# Patient Record
Sex: Male | Born: 1935 | Race: White | Hispanic: No | Marital: Married | Smoking: Former smoker
Health system: Southern US, Community
[De-identification: ages and names within clinical notes are randomized; demographics above are authoritative.]

## PROBLEM LIST (undated history)

## (undated) DIAGNOSIS — E785 Hyperlipidemia, unspecified: Secondary | ICD-10-CM

## (undated) DIAGNOSIS — I10 Essential (primary) hypertension: Secondary | ICD-10-CM

## (undated) DIAGNOSIS — I2510 Atherosclerotic heart disease of native coronary artery without angina pectoris: Secondary | ICD-10-CM

## (undated) DIAGNOSIS — M109 Gout, unspecified: Secondary | ICD-10-CM

## (undated) HISTORY — DX: Essential (primary) hypertension: I10

## (undated) HISTORY — PX: CORONARY ARTERY BYPASS GRAFT: SHX141

## (undated) HISTORY — DX: Gout, unspecified: M10.9

## (undated) HISTORY — DX: Hyperlipidemia, unspecified: E78.5

## (undated) HISTORY — DX: Atherosclerotic heart disease of native coronary artery without angina pectoris: I25.10

---

## 2002-01-23 ENCOUNTER — Encounter: Payer: Self-pay | Admitting: Cardiothoracic Surgery

## 2002-01-23 ENCOUNTER — Inpatient Hospital Stay (HOSPITAL_COMMUNITY): Admission: EM | Admit: 2002-01-23 | Discharge: 2002-02-01 | Payer: Self-pay | Admitting: Internal Medicine

## 2002-01-24 ENCOUNTER — Encounter: Payer: Self-pay | Admitting: Cardiothoracic Surgery

## 2002-01-25 ENCOUNTER — Encounter: Payer: Self-pay | Admitting: Cardiothoracic Surgery

## 2002-01-26 ENCOUNTER — Encounter: Payer: Self-pay | Admitting: Cardiothoracic Surgery

## 2002-01-26 ENCOUNTER — Encounter: Payer: Self-pay | Admitting: Nephrology

## 2002-01-28 ENCOUNTER — Encounter: Payer: Self-pay | Admitting: Cardiothoracic Surgery

## 2002-01-29 ENCOUNTER — Encounter: Payer: Self-pay | Admitting: Cardiothoracic Surgery

## 2005-07-01 ENCOUNTER — Ambulatory Visit: Payer: Self-pay | Admitting: Cardiology

## 2005-07-01 ENCOUNTER — Encounter: Payer: Self-pay | Admitting: Cardiology

## 2005-07-11 ENCOUNTER — Ambulatory Visit: Payer: Self-pay | Admitting: Cardiology

## 2005-07-11 ENCOUNTER — Encounter: Payer: Self-pay | Admitting: Cardiology

## 2005-10-21 ENCOUNTER — Ambulatory Visit (HOSPITAL_COMMUNITY): Admission: RE | Admit: 2005-10-21 | Discharge: 2005-10-21 | Payer: Self-pay | Admitting: General Surgery

## 2005-11-20 ENCOUNTER — Ambulatory Visit: Payer: Self-pay | Admitting: Cardiology

## 2006-07-22 ENCOUNTER — Ambulatory Visit: Payer: Self-pay | Admitting: Cardiology

## 2007-08-05 ENCOUNTER — Ambulatory Visit (HOSPITAL_COMMUNITY): Admission: RE | Admit: 2007-08-05 | Discharge: 2007-08-05 | Payer: Self-pay | Admitting: Family Medicine

## 2007-08-25 ENCOUNTER — Ambulatory Visit: Payer: Self-pay | Admitting: Cardiology

## 2007-08-26 ENCOUNTER — Ambulatory Visit: Payer: Self-pay | Admitting: Cardiology

## 2007-09-03 ENCOUNTER — Ambulatory Visit: Payer: Self-pay | Admitting: Cardiology

## 2008-08-17 ENCOUNTER — Ambulatory Visit: Payer: Self-pay | Admitting: Cardiology

## 2009-04-17 ENCOUNTER — Ambulatory Visit: Payer: Self-pay | Admitting: Cardiology

## 2009-05-03 ENCOUNTER — Ambulatory Visit: Payer: Self-pay | Admitting: Cardiology

## 2009-05-07 ENCOUNTER — Encounter: Payer: Self-pay | Admitting: Physician Assistant

## 2009-05-23 ENCOUNTER — Ambulatory Visit: Payer: Self-pay | Admitting: Cardiology

## 2009-05-24 ENCOUNTER — Encounter: Payer: Self-pay | Admitting: Cardiology

## 2009-08-03 DIAGNOSIS — E785 Hyperlipidemia, unspecified: Secondary | ICD-10-CM | POA: Insufficient documentation

## 2009-08-03 DIAGNOSIS — I25810 Atherosclerosis of coronary artery bypass graft(s) without angina pectoris: Secondary | ICD-10-CM

## 2009-08-03 DIAGNOSIS — I10 Essential (primary) hypertension: Secondary | ICD-10-CM | POA: Insufficient documentation

## 2010-01-21 ENCOUNTER — Encounter (INDEPENDENT_AMBULATORY_CARE_PROVIDER_SITE_OTHER): Payer: Self-pay | Admitting: *Deleted

## 2010-05-09 ENCOUNTER — Ambulatory Visit: Payer: Self-pay | Admitting: Cardiology

## 2010-12-17 NOTE — Miscellaneous (Signed)
Summary: Refill Metoprolol  Clinical Lists Changes  Medications: Changed medication from METOPROLOL TARTRATE 25 MG TABS (METOPROLOL TARTRATE) to METOPROLOL TARTRATE 25 MG TABS (METOPROLOL TARTRATE) Take 1/2 tablet by mouth two times a day - Signed Rx of METOPROLOL TARTRATE 25 MG TABS (METOPROLOL TARTRATE) Take 1/2 tablet by mouth two times a day;  #90 x 3;  Signed;  Entered by: Cyril Loosen, RN, BSN;  Authorized by: Lewayne Bunting, MD, [facility];  Method used: Electronically to [facility] [address] [place], [place], [place]  [address], Ph: [phone], Fax: [phone]    Prescriptions: METOPROLOL TARTRATE 25 MG TABS (METOPROLOL TARTRATE) Take 1/2 tablet by mouth two times a day  #90 x 3   Entered by:   Cyril Loosen, RN, BSN   Authorized by:   Lewayne Bunting, MD, [facility]   Signed by:   Cyril Loosen, RN, BSN on 01/21/2010   Method used:   Electronically to        [facility] (retail)       [address]       [place], [place]  [address]       Ph: [phone]       Fax: [phone]   RxID:   [id number]

## 2010-12-17 NOTE — Assessment & Plan Note (Signed)
Summary: 1 YR F/U PER REMINDER-JM   Visit Type:  Follow-up Primary Provider:  McGough   History of Present Illness: the patient is a 76 year old male with a history of anterior wall microinfarction, status post emergent bypass grafting in April 2003.  Follow-up echocardiogram demonstrated a normal ejection fraction.  Stress test in 2000 and it was also within normal limits.  The patient is doing quite well.  He reports no chest pain or shortness of breath orthopnea PND.  His blood pressure however is poorly controlled.  Preventive Screening-Counseling & Management  Alcohol-Tobacco     Smoking Status: quit     Year Quit: 01/2002  Current Medications (verified): 1)  Furosemide 20 Mg Tabs (Furosemide) .... Take 1 Tablet By Mouth Daily 2)  Lisinopril 20 Mg Tabs (Lisinopril) .... Take 2 Tablet By Mouth Once A Day 3)  Metoprolol Tartrate 25 Mg Tabs (Metoprolol Tartrate) .... Take 1/2 Tablet By Mouth Two Times A Day 4)  Folic Acid 1 Mg Tabs (Folic Acid) .... Take 1 Tablet By Mouth Once A Day 5)  Allopurinol 300 Mg Tabs (Allopurinol) .... Take 1 Tablet By Mouth Once A Day 6)  Simvastatin 10 Mg Tabs (Simvastatin) .... Take 1 Tablet By Mouth Once A Day 7)  Levothyroxine Sodium 50 Mcg Tabs (Levothyroxine Sodium) .... Take 1 Tablet By Mouth Once A Day 8)  Aspir-Low 81 Mg Tbec (Aspirin) .... Take 1 Tablet By Mouth Once A Day 9)  Fish Oil 1200 Mg Caps (Omega-3 Fatty Acids) .... Take 3 Tablet By Mouth Once A Day 10)  Amlodipine Besylate 5 Mg Tabs (Amlodipine Besylate) .... Take 1 Tablet By Mouth Once A Day  Allergies (verified): No Known Drug Allergies  Comments:  Nurse/Medical Assistant: The patient's medication list and allergies were reviewed with the patient and were updated in the Medication and Allergy Lists.  Past History:  Past Medical History: Last updated: 08/03/2009 CAD, ARTERY BYPASS GRAFT (ICD-414.04) HYPERLIPIDEMIA-MIXED (ICD-272.4) HYPERTENSION, UNSPECIFIED (ICD-401.9)     Past Surgical History: Last updated: 08/03/2009 CABG  Social History: Smoking Status:  quit  Vital Signs:  Patient profile:   76 year old male Height:      63 inches Weight:      152 pounds BMI:     27.02 Pulse rate:   57 / minute BP sitting:   171 / 81  (left arm) Cuff size:   regular  Vitals Entered By: Carlye Grippe (May 09, 2010 3:17 PM)  Nutrition Counseling: Patient's BMI is greater than 25 and therefore counseled on weight management options.  Physical Exam  Additional Exam:  General: Well-developed, well-nourished in no distress head: Normocephalic and atraumatic eyes PERRLA/EOMI intact, conjunctiva and lids normal nose: No deformity or lesions mouth normal dentition, normal posterior pharynx neck: Supple, no JVD.  No masses, thyromegaly or abnormal cervical nodes lungs: Normal breath sounds bilaterally without wheezing.  Normal percussion heart: regular rate and rhythm with normal S1 and S2, no S3 or S4.  PMI is normal.  No pathological murmurs abdomen: Normal bowel sounds, abdomen is soft and nontender without masses, organomegaly or hernias noted.  No hepatosplenomegaly musculoskeletal: Back normal, normal gait muscle strength and tone normal pulsus: Pulse is normal in all 4 extremities Extremities: No peripheral pitting edema neurologic: Alert and oriented x 3 skin: Intact without lesions or rashes cervical nodes: No significant adenopathy psychologic: Normal affect     EKG  Procedure date:  05/09/2010  Findings:      normal sinus rhythm.  Nonpathological  inferior Q waves.  For all the progression and T wave inversions across the anterior precordial leads consistent with prior anterior wall myocardial infarction  Impression & Recommendations:  Problem # 1:  HYPERTENSION, UNSPECIFIED (ICD-401.9) amlodipine at 5 mg p.o. daily was added the patient can follow-up with his primary care physician His updated medication list for this problem  includes:    Furosemide 20 Mg Tabs (Furosemide) .Marland Kitchen... Take 1 tablet by mouth daily    Lisinopril 20 Mg Tabs (Lisinopril) .Marland Kitchen... Take 2 tablet by mouth once a day    Metoprolol Tartrate 25 Mg Tabs (Metoprolol tartrate) .Marland Kitchen... Take 1/2 tablet by mouth two times a day    Aspir-low 81 Mg Tbec (Aspirin) .Marland Kitchen... Take 1 tablet by mouth once a day    Amlodipine Besylate 5 Mg Tabs (Amlodipine besylate) .Marland Kitchen... Take 1 tablet by mouth once a day  Problem # 2:  HYPERLIPIDEMIA-MIXED (ICD-272.4) the patient is only on simvastatin 10 mg a day which is allowed with his new dosing of amlodipine. His updated medication list for this problem includes:    Simvastatin 10 Mg Tabs (Simvastatin) .Marland Kitchen... Take 1 tablet by mouth once a day  Problem # 3:  CAD, ARTERY BYPASS GRAFT (ICD-414.04) the patient has no recurrent chest pain.  No indication for stress testing at the present time.  EKG shows no acute ischemic changes.  There is probably a progression in chronically inverted T waves. His updated medication list for this problem includes:    Lisinopril 20 Mg Tabs (Lisinopril) .Marland Kitchen... Take 2 tablet by mouth once a day    Metoprolol Tartrate 25 Mg Tabs (Metoprolol tartrate) .Marland Kitchen... Take 1/2 tablet by mouth two times a day    Aspir-low 81 Mg Tbec (Aspirin) .Marland Kitchen... Take 1 tablet by mouth once a day    Amlodipine Besylate 5 Mg Tabs (Amlodipine besylate) .Marland Kitchen... Take 1 tablet by mouth once a day  Orders: EKG w/ Interpretation (93000)  Patient Instructions: 1)  Amlodipine 5mg  daily 2)  Follow up in  1 year Prescriptions: AMLODIPINE BESYLATE 5 MG TABS (AMLODIPINE BESYLATE) Take 1 tablet by mouth once a day  #30 x 6   Entered by:   Hoover Brunette, LPN   Authorized by:   Lewayne Bunting, MD, [facility]   Signed by:   Hoover Brunette, LPN on 16/08/9603   Method used:   Electronically to        [facility] (retail)       [address]       [place], [place]  [address]       Ph: [phone]       Fax: [phone]   RxID:    [id number]   Handout requested.

## 2011-01-22 ENCOUNTER — Telehealth: Payer: Self-pay | Admitting: *Deleted

## 2011-02-13 NOTE — Progress Notes (Signed)
Summary: PHONE- swelling right foot       Phone Note Call from Patient Call back at Home Phone [phone]   Caller: Patient Summary of Call: [name] called and states he thinks he needs to be seen. States that his right foot has been swelling since Monday. Does not think it is edema but not sure. Family Doctor is [facility] and states that they can not see him until next week.   Initial call taken by: Zachary George,  January 22, 2011 1:25 PM  Follow-up for Phone Call        Left message to return call.  Hoover Brunette, LPN  January 23, 980 11:01 AM   Left message to see PMD or call back if issue not resolved. Follow-up by: Hoover Brunette, LPN,  February 06, 2011 3:13 PM

## 2011-02-27 ENCOUNTER — Other Ambulatory Visit: Payer: Self-pay | Admitting: Cardiology

## 2011-04-01 NOTE — Assessment & Plan Note (Signed)
[facility]                          [facility] OFFICE NOTE   NAME:Imel, MINER KORAL                     MRN:          [id number]  DATE:04/17/2009                            DOB:          [birth year]    HISTORY OF PRESENT ILLNESS:  The patient is a 76 year old male with a  history of anterior wall myocardial function, status post emergent  bypass graft in April 2003.  The patient had a followup echocardiogram,  which showed a normalized ejection fraction.  He also had a stress test  in 2008.  He states he continues to feel quite well.  He has no chest  pain, shortness of breath, orthopnea, or PND.  He has no palpitations or  syncope.  He reports no cardiovascular symptoms.  Unfortunately, his  blood pressure is quite elevated, although the last office visit was  within normal limits.  He feels that he has white coat hypertension.   MEDICATIONS:  1. Folic acid 1 mg a day.  2. Allopurinol 300 mg p.o. daily.  3. Furosemide 20 mg a day.  4. Lisinopril 20 mg p.o. daily.  5. Lescol XL 80 mg p.o. daily.  6. Levothyroxine 50 mcg p.o. daily.  7. Aspirin 81 mg a day.  8. Fish oil 1000 mg p.o. daily.  9. Metoprolol tartrate 25 mg half-tablet p.o. b.i.d.  10.Toprol-XL 25 mg p.o. daily.   We will found out if the patient is both taking metoprolol and Toprol on  his medication list.   PHYSICAL EXAMINATION:  VITAL SIGNS:  Blood pressure is 162/82, heart  rate is 52, weight is 154 pounds.  GENERAL:  Well-nourished white male in no apparent stress.  NECK:  Normal carotid upstroke.  No carotid bruits.  LUNGS:  Clear breath sounds bilaterally.  HEART:  Regular rate and rhythm.  Normal S1 and S2.  No murmur, rubs, or  gallops.  ABDOMEN:  Soft, nontender.  No rebound or guarding.  Good bowel sounds.  EXTREMITIES:  No cyanosis, clubbing, or edema.   PROBLEM LIST:  1. Coronary artery disease.  See details above.  2. Hypertension, poorly controlled.  Rule out  white coat hypertension.  3. Dyslipidemia, followed by Dr. Regino Schultze.   PLAN:  1. The patient's blood pressure is poorly controlled, but we are not      sure of his white coat hypertension.  The patient will have his      blood pressure check at [facility] on several      occasions, and then he will come for a nurse visit in 2 weeks to      review.  2. In 1 year, the patient requires another stress test.  We will      continue otherwise on his current medical regimen.     Learta Codding, MD,FACC  Electronically Signed    GED/MedQ  DD: 04/17/2009  DT: 04/18/2009  Job #: [id number]

## 2011-04-01 NOTE — Assessment & Plan Note (Signed)
[facility]                          [facility]ICE NOTE   NAME:[name], [name]                     MRN:          [id number]  DATE:08/17/2008                            DOB:          1935-08-07    HISTORY OF PRESENT ILLNESS:  The patient is a 75 year old male with  history of anterior wall myocardial infarction status post emergent  bypass graft in April 2003.  The patient is doing well.  He has no  recurrence of substernal chest pain.  He has no orthopnea or PND.  He  does a lot of chores around the house without any difficulty.  He states  he feels quite well and has a good appetite, has lost no weight.   MEDICATIONS:  1. Folic acid 1 mg p.o. daily.  2. Allopurinol 300 mg p.o. daily.  3. Furosemide 20 mg p.o. daily.  4. Lisinopril 20 mg p.o. daily.  5. __________ 80 mg p.o. daily.  6. Levothyroxine 50 mcg p.o. daily.  7. Aspirin 81 mg p.o. daily.  8. Fish oil 1000 mg p.o. daily.  9. Metoprolol 25 mg half a tablet b.i.d.   PHYSICAL EXAMINATION:  VITAL SIGNS:  Blood pressure 120/74, heart rate  61, weight is 157 pounds.  NECK:  Normal carotid upstroke.  No carotid bruits.  LUNGS:  Clear.  HEART:  Regular rate and rhythm.  Normal S1 and S2.  No murmurs, rubs,  or gallop.  ABDOMEN:  Soft.  EXTREMITIES:  No cyanosis, clubbing, or edema.  NEUROLOGIC:  The patient is alert, oriented, and grossly nonfocal.   PROBLEM LIST:  1. Coronary artery disease.      a.     Status post acute inferior wall myocardial infarction with       right ventricular involvement __________ March 2008.      b.     Status post emergent coronary artery bypass graft.      c.     Ejection fraction is 40% normalized in followup.      d.     No recurrent chest pain, hemodynamically stable.  2. Dyslipidemia.  3. Tobacco use.  4. Hypertension, controlled.   PLAN:  The patient is doing remarkably well.  His lipid panel is  followed by his primary care physician.  He  had a stress test last year  and is not due for another 2 years for his stress test.  I  have made no change in his medications.  EKG in the office today was  essentially within normal limits.     Learta Codding, MD,FACC  Electronically Signed    GED/MedQ  DD: 08/17/2008  DT: 08/18/2008  Job #: [id number]

## 2011-04-01 NOTE — Assessment & Plan Note (Signed)
[facility]                          [facility]ICE NOTE   NAME:[name], [name]                     MRN:          [id number]  DATE:08/25/2007                            DOB:          Sep 08, 1935    HISTORY OF PRESENT ILLNESS:  The patient is a 76 year old male with a  history of anterior wall myocardial infarction, status post emergent  coronary bypass graft in April of 2003.  The patient has been doing  well.  He has had no stress testing done since 2003.  He reports no  chest pain, shortness of breath, orthopnea, PND.  Unfortunately, he did  not bring his medication list.  He states that he is on several  antihypertensive medications, but he is quite hypertensive in the office  today.  His EKG demonstrates normal sinus rhythm, no acute ischemic  changes.  He states that his exercise tolerance is actually quite good.  He blames his blood pressure problem today on the fact that he ate quite  a bit of bacon with salt this morning.   MEDICATIONS:  Unknown.  The patient will bring list in the morning.   PHYSICAL EXAMINATION:  VITAL SIGNS:  Blood pressure is 150/84, heart  rate is 52, weight is 156 pounds.  NECK EXAM:  Reveals a normal carotid upstroke, no carotid bruits.  LUNGS:  Clear breath sounds bilaterally.  HEART:  Regular rate and rhythm, normal S1, S2, no murmurs, rubs, or  gallops.  ABDOMEN:  Soft, nontender, no rebound or guarding.  Good bowel sounds.  EXTREMITY EXAM:  No cyanosis, clubbing or edema.  NEURO:  The patient is alert, oriented, grossly nonfocal.   PROBLEM LIST:  1. Coronary artery disease.      a.     Status post acute inferior wall myocardial infarction with       right ventricular involvement and cardiogenic shock, March 2003.      b.     Status post emergent coronary bypass graft.      c.     Ejection fraction 40%, normalized in followup.  2. Dyslipidemia.  3. History of tobacco use.  4. Hypertension.   PLAN:  1. The patient is doing quite well.  Unfortunately, he is hypertensive      and has really no idea about his blood pressure medications.  He      will, however, provide [place] with the medication list tomorrow.  2. Adjustments in medication will be made after we see the patient's      list.  3. The patient also will need a Cardiolite study as part of his      ischemia evaluation exam, more than 3 years post coronary bypass      graft.  4. The patient can follow up with [place], otherwise, in 1 year, and we      will defer his lipid management to his primary care physician, Dr.      Regino Schultze.     Learta Codding, MD,FACC  Electronically Signed    GED/MedQ  DD: 08/25/2007  DT: 08/26/2007  Job #: [id number]  cc:   Kirk Ruths, M.D.

## 2011-04-04 NOTE — H&P (Signed)
NAME:  [name], [name]              ACCOUNT NO.:  [id number]   MEDICAL RECORD NO.:  [id number]          PATIENT TYPE:  AMB   LOCATION:                                FACILITY:  [facility]   PHYSICIAN:  Dalia Heading, M.D.  DATE OF BIRTH:  1935/02/08   DATE OF ADMISSION:  DATE OF DISCHARGE:  LH                                HISTORY & PHYSICAL   CHIEF COMPLAINT:  Need for screening colonoscopy.   HISTORY OF PRESENT ILLNESS:  The patient is a 76 year old white male who is  referred for endoscopic evaluation.  He needs a colonoscopy for screening  purposes.  No abdominal pain, weight loss, nausea, vomiting, diarrhea,  constipation, melena, or hematochezia have been noted.  He has never had a  colonoscopy.  There is no family history of colon carcinoma.   PAST MEDICAL HISTORY:  1.  Hypertension.  2.  Coronary artery disease.   PAST SURGICAL HISTORY:  CABG in 2003.   CURRENT MEDICATIONS:  Lescol, Toprol, Lisinopril, folic acid, Lasix, baby  aspirin, fish oil.   ALLERGIES:  No known drug allergies.   REVIEW OF SYSTEMS:  Noncontributory.   PHYSICAL EXAMINATION:  GENERAL:  The patient is a well-developed, well-  nourished, white male in no acute distress.  LUNGS:  Clear to auscultation with equal breath sounds bilaterally.  HEART:  Reveals a regular rate and rhythm without S3, S4, or murmurs.  ABDOMEN:  Soft, nontender, nondistended.  No hepatosplenomegaly or masses  are noted.  RECTAL:  Deferred to the procedure.   IMPRESSION:  Need for screening colonoscopy.   PLAN:  The patient is scheduled for a colonoscopy on October 21, 2005.  The  risks and benefits of the procedure, including bleeding and perforation,  were fully explained to the patient, gave informed consent.      Dalia Heading, M.D.  Electronically Signed     MAJ/MEDQ  D:  09/30/2005  T:  09/30/2005  Job:  [id number]   cc:   Dalia Heading, M.D.  Fax: [phone]   [facility]  Fax: [phone]   Corrie Mckusick, M.D.  Fax: [phone]

## 2011-04-04 NOTE — Cardiovascular Report (Signed)
[facility]  Patient:    ANKITH, EDMONSTON Visit Number: [id number] MRN: [id number]          Service Type: MED Location: 2300 [id number] Attending Physician:  Waldo Laine Dictated by:   Veneda Melter, M.D. [facility] Proc. Date: 01/23/02 Admit Date:  01/23/2002   CC:         Dr. Lilian Kapur, [place]  Doylene Canning. Ladona Ridgel, M.D. [facility]   Cardiac Catheterization  PROCEDURES PERFORMED: 1. Left heart catheterization. 2. Left ventriculogram. 3. Selective coronary angiography. 4. Abdominal aortogram. 5. Placement of temporary right ventricular pacemaker. 6. Placement of intra-aortic balloon pump.  DIAGNOSES: 1. Three-vessel coronary artery disease. 2. Mild left ventricular systolic dysfunction. 3. Acute inferior wall myocardial infarction. 4. Third degree AV block. 5. Cardiogenic shock.  HISTORY:  Mr. Hoffmann is a 76 year old gentleman with a history of hypertension, gastroesophageal reflux disease as well as a long history of tobacco use who presents with substernal chest discomfort.  The patient had pain starting approximately at midnight of January 22, 2002.  This waxed and waned throughout the night and at approximately 10:10 a.m., he presented to [facility].  He was found to have acute inferior wall myocardial infarction with hypertension and third degree AV block.  He was transferred to [facility] for emergent cardiac catheterization.  Upon presentation, the patient had persistent third degree AV block as well as hypertension despite inotropes and IV fluids.  He had some nausea and discomfort in route that improved upon arrival to [facility].  DESCRIPTION OF PROCEDURE:  Informed consent was obtained.  The patient was brought to the catheterization lab.  Both groins were sterilely prepped and draped.  Lidocaine, 1%, was used to infiltrate the right groin and a #7 [place] arterial and a #6 [place] venous sheath placed using the modified  Seldinger technique.  A temporary RV pacemaker was then placed via the right femoral vein at the RV apex and appropriate pacing parameters set.  Left heart catheterization, left ventriculogram, abdominal aortogram, and selective coronary angiography were then performed using preformed #6 French Judkins catheters.  Initial findings are as follows:  FINDINGS: 1. Left main trunk:  Mild irregularities, mid narrowing of 20%. 2. LAD:  This begins as a large caliber vessel and tapers in the mid section.    The LAD has a high-grade eccentric narrowing of at least 95% in the    proximal segment.  There is TIMI-2 flow in the distal vessel. 3. Left circumflex artery:  Medium caliber vessel that consists of a    bifurcating marginal branch in the mid section.  The AV circumflex has a    high-grade narrowing of 70% prior to this marginal branch. 4. Ramus intermedius:  This is a small caliber vessel with mild disease of    30% in the proximal segment. 5. Right coronary artery:  This is a medium caliber vessel that is 100%    occluded proximally.  No distal filling is noted.  LEFT VENTRICULOGRAM:  LV -- mildly dilated end systolic and end diastolic dimensions.  Overall left ventricular function is mildly impaired.  Ejection fraction approximately 40%.  There is akinesis of the inferior wall.  Mitral regurgitation, 1+, noted.  HEMODYNAMIC DATA:  LV pressure is 66/15.  Aortic was 66/40.  LVEDP equals 20.  ABDOMINAL AORTOGRAM:  The abdominal aorta is normal caliber with mild atheromatous buildup.  The renal arteries are single and widely patent bilaterally.  The iliac arteries have mild  disease in the proximal segment.  With these findings, we proceeded with placement of percutaneous balloon pump. The left groin was infiltrated with lidocaine and a balloon pump placed in the descending aorta.  This was seen to function well.  The patient subsequently had blood pressure of 75/47.  He had a heart rate  of 70 paced.  Mean pressure was 70.  Augmented systolic was 99.  This was with fluid wide open and dopamine at 5 cc per hour.  Given the extensive nature of his coronary disease, Dr. Tyrone Sage of the surgical service was consulted and it was felt that the patient would benefit from emergent surgical revascularization. Preparations were thus made to transfer the patient to the operating theatre. He was transferred in critical condition. Dictated by:   Veneda Melter, M.D. [facility] Attending Physician:  Waldo Laine DD:  01/23/02 TD:  01/23/02 Job: [id number]

## 2011-04-04 NOTE — Consult Note (Signed)
[name]. [facility]  Patient:    DEMONE, [name] Visit Number: [id number] MRN: [id number]          Service Type: MED Location: 2300 [id number] [id number] Attending Physician:  [name] Dictated by:   [name], M.D. Proc. Date: 01/23/02 Admit Date:  01/23/2002   CC:         [name], M.D. [facility]   Consultation Report  PHYSICIANS: Requesting physician:  [name], M.D. [facility] Followup cardiologist:  [name], M.D. [facility] Primary care physician: Unknown.  REASON FOR CONSULTATION:  Emergency coronary artery bypass, acute myocardial infarction, cardiogenic shock.  HISTORY OF PRESENT ILLNESS:  The patient is a 76 year old male who approximately 12 to 14 hours prior to consultation began having prolonged episode of chest pain and "feeling bad."  During the night, his wife noted that he tossed and turned and was uncomfortable.  Earlier today, he finally went to the emergency room in [place] and was noted to have EKG changes of acute myocardial infarction with complete heart block, hypotension with blood pressure in the 70s.  CK on admission to [facility] was 1037 with MB of 186, troponin 6.88.  PAST MEDICAL HISTORY:  The patient denies diabetes.  He is a heavy smoker and has for many years.  Denies previous TIAs or strokes, denies peripheral vascular disease.  The patient denies renal insufficiency, but his creatinine at [facility] was 2.3 today.  The patient denies any previous surgery.  SOCIAL HISTORY:  The patient is married, lives with his wife and is employed as a Sports coach.  MEDICATIONS:  The patient was unsure of his home medications.  He was given a bolus of Integrilin and heparin and was on dopamine infusion at the time of exam.  DRUG ALLERGIES:  None known.  REVIEW OF SYSTEMS:  Performed and recorded by the cardiologist.  In general, the patient had no previous constitutional symptoms, but in the past 12 hours had  become gravely ill.  Denies previous shortness of breath or hemoptysis. Denies any history of GI bleed.  Denies any psychiatric history.  Denies claudication.  PHYSICAL EXAMINATION:  GENERAL:  The patient was plethoric from the mid chest up, cyanotic, on oxygen and in obvious distress and diaphoretic.  VITAL SIGNS:  Blood pressure was in the high 70s on dopamine.  Heart rate was 70, ventricularly paced by transvenous temporary pacer.  Respiratory rate 20.  HEENT:  Pupils were equal and reactive to light.  NECK:  Without carotid bruits.  CHEST:  Distant breath sounds bilaterally without wheezes.  CARDIAC:  The intra-aortic balloon pump was audible.  I do not hear any murmur of mitral regurgitation or murmur consistent with the VSD.  ABDOMEN:  Benign without palpable masses.  EXTREMITIES:  The patient has faintly palpable DP and PT pulses bilaterally, appears to have adequate vein for bypass in both lower extremities.  DIAGNOSTIC DATA:  Labs done at [facility]: Hematocrit was 38.6, white count 10.9, platelet count 222,000.  BUN 18, creatinine 2.3, potassium 4.3. Other labs are pending.  Cardiac catheterization: Placement of balloon pump was performed here.   His left main is patent.  He has greater than 95% long stenosis of the LAD, 70% proximal stenosis of the circumflex.  The right coronary artery is stumped off in the proximal vessel with very little collateral filling evident.  Ejection fraction is approximately 30 to 40% with inferior hypokinesis.  ASSESSMENT:  After reviewing the films and  anatomic situation, the patient has little chance for survival without proceeding with emergency coronary artery bypass grafting with complete heart block cardiogenic shock and critical three-vessel disease, probably with element of right ventricular infarct because of the total occlusion of the right coronary artery.  This was discussed with the patient and his family, and the  patient and family were agreeable with proceeding.  They understand the increased risk of the procedure.  IMPRESSION:  The patient has acute myocardial infarction x 12 hours with right ventricular infarct, cardiogenic shock, complete heart block, and critical three-vessel coronary artery disease.  PLAN:  Emergency coronary artery bypass grafting. Dictated by:   [name], M.D. Attending Physician:  [name] DD:  01/23/02 TD:  01/24/02 Job: [id number]

## 2011-04-04 NOTE — H&P (Signed)
[place]. [facility]  Patient:    [name], [name] Visit Number: [id number] MRN: [id number]          Service Type: MED Location: 2300 [id number] [id number] Attending Physician:  Waldo Laine Dictated by:   Doylene Canning. Ladona Ridgel, M.D. [facility] Admit Date:  01/23/2002   CC:         _________________   History and Physical  ADMITTING DIAGNOSIS:  Acute myocardial infarction complicated by hypotension and shock.  HISTORY OF PRESENT ILLNESS:  The patient is a very pleasant 76 year old man who has otherwise been healthy, whose past medical history is notable for hypertension and tobacco abuse.  The patient was in his usual state of health until this past midnight, when he developed substernal chest pain which was off and on over the course of several hours.  He presented to the [facility] Emergency Department where he was found to have acute inferior myocardial infarction with hypotension and bradycardia.  He was subsequently treated with dopamine and fluids and his pressure increased into the 140-to-150 range and he was transferred down for additional evaluation and treatment.  The patient denies syncope or near-syncope.  He denies a previous history of chest pain or shortness of breath and has overall been healthy.  PAST MEDICAL HISTORY:  His past medical history is previously noted with hypertension as well as with tobacco abuse.  There is a remote history of hyperlipidemia.  FAMILY HISTORY:  The family is notable for father dying of a stroke and mother dying of myocardial infarction.  SOCIAL HISTORY:  The social history is notable for one to two packs of cigarettes per day.  He denies any ethanol abuse.  He is married.  REVIEW OF SYSTEMS:  Negative for headaches or visual changes.  He denies any hearing problems.  He denies any chest pain or shortness of breath up until his previous admission.  He denies any hemoptysis or cough.  He denies any nausea, vomiting,  diarrhea or constipation.  He denies any changes in his bowel habit.  He denies any problems with arthritis.  He denies any skin problems.  He denies any neurologic changes.  PHYSICAL EXAMINATION:  GENERAL:  He is a pleasant middle-aged man acutely ill but in no respiratory distress.  HEENT:  Normocephalic and atraumatic.  The pupils were equal and round.  The oropharynx was moist.  NECK:  No jugular venous distention.  The thyroid was not appreciably enlarged.  CARDIOVASCULAR:  Irregular bradycardia with an S4 gallop.  ABDOMEN:  Soft, nontender, nondistended.  There is no organomegaly.  EXTREMITIES:  No cyanosis, clubbing or edema.  His distal extremities were warm.  LABORATORY AND ACCESSORY DATA:  EKG demonstrates complete heart block with inferior ST elevation consistent with inferior myocardial infarction.  IMPRESSION: 1. Acute myocardial infarction. 2. History of hypertension. 3. Tobacco abuse.  DISCUSSION:  The patient will be taken urgently to the cardiac catheterization lab for urgent catheterization and possible percutaneous intervention. Dictated by:   Doylene Canning. Ladona Ridgel, M.D. [facility] Attending Physician:  Waldo Laine DD:  01/23/02 TD:  01/24/02 Job: [id number]

## 2011-04-04 NOTE — Op Note (Signed)
[name]. [facility]  Patient:    [name], [name] Visit Number: [id number] MRN: [id number]          Service Type: MED Location: 2000 2006 01 Attending Physician:  [name] Dictated by:   Lissa Merlin, P.A. Proc. Date: 01/23/02 Admit Date:  01/23/2002 Discharge Date: 02/01/2002   CC:         [name]. [name], M.D. [facility]  CVTS Office  [name], M.D. [facility]   Operative Report  DATE OF BIRTH: 02-Jan-1935  ADMISSION DIAGNOSIS: Acute inferior myocardial infarction with cardiogenic shock.  DISCHARGE DIAGNOSES: 1. Status post acute myocardial infarction. 2. Severe three vessel coronary artery disease with mild left ventricular    dysfunction. EF of 40%. 3. Cardiogenic shock, status post balloon pump. 4. Acute renal insufficiency, resolved. 5. Chronic obstructive pulmonary disease. 6. Mild deconditioning.  PAST MEDICAL HISTORY: 1. No previous history of coronary artery disease. He did have a family    history of coronary artery disease, however. 2. Heavy smoking. 3. Hyperlipidemia.  HISTORY OF PRESENT ILLNESS: The patient is a 76 year old male who was in his usual state of good health when 12-14 hours prior to admission, he began having chest pain and general malaise. During the night, he tossed and turned and was uncomfortable. The next morning, he went to the emergency room in [place] and was found to have an acute myocardial infarction with complete heart block, hypotension with blood pressure in the 70s. He was transferred to [facility]. He went to the cardiac cath lab emergently. This showed advanced three vessel coronary disease with mild left ventricular dysfunction. Because the inferior MI was complicated with right ventricular involvement and cardiac shock, in addition to third degree heart-block, [name] was consulted for emergent CABG. Intra-aortic balloon pump was inserted in the interim time. [name]  agreed that emergency CABG was the patients only chance of survival. This was explained to the patient and it was agreed to proceed.  HOSPITAL COURSE: He went to the operating room emergently and had CABG times three with the following grafts: Single to LAD, saphenous vein graft to distal RCA, saphenous vein graft to circumflex. He tolerated the procedure well. Postop, [name] initially required ionotropics and continued use of the balloon pump to maintain adequate blood pressure. This continued until postop day #2 when the balloon pump was discontinued and his drips were weaned off. He remained stable off the drips and the balloon pump. He was extubated on postop day #2. He was also found to have mild renal insufficiency with creatinine of 1.7. This slowly came down to normal. [name] was transferred to unit 2000 where he began working with cardiac rehab. He had some mild deconditioning and required a walker but did fairly well. He also had smoking cessation consult. By February 01, 2002, postop day #9, he was doing well on unit 2000. He was in normal sinus rhythm. Blood pressure was satisfactory. He was afebrile. He was diuresing well. Creatinine was 1.3.  He had no complaints. Physical exam was satisfactory. Lungs were clear. There was no edema in the extremities. Wounds were healing well. He was discharged home in stable condition that afternoon.  PROCEDURES: 1. Emergent cardiac cath on January 23, 2002. 2. Emergent CABG X3 on January 23, 2002.  MEDICATIONS: 1. Toprol XL 25 mg 1 p.o. q.a.m. 2. Enteric coated aspirin 325 mg 1 p.o. q.d. 3. Altace 2.5 mg 1 p.o. q.d. 4. Folic acid 1  mg p.o. q.d. 5. Percocet 5/325 1-2 p.o. q.4-6h. p.r.n. for pain. 6. Lasix 40 mg 1 p.o. q.d. 7. KCL 20 meq 1 p.o. q.d.  ALLERGIES: None known.  DISCHARGE CONDITION: Stable.  SPECIAL INSTRUCTIONS: [name] was told to do no driving, heavy lifting, strenuous activity. He was told to walk daily and  use his incentive spirometry daily. He was told that he could shower and to clean his wounds gently daily with soap and water and to call the office if he noticed anything unusual with his wounds or if he developed a fever. He was told to get a chest x-ray when he saw his cardiologist in follow-up and to bring it with him to see [name].  FOLLOW-UP: 1. He was to see [name], P.A.C. with [name] at the [facility]    on February 21, 2002 at 2:15 pm. 2. [name] three weeks after discharge. Office would call with appointment. Dictated by:   Lissa Merlin, P.A. Attending Physician:  [name] DD:  02/28/02 TD:  02/28/02 Job: [id number]

## 2011-04-04 NOTE — Op Note (Signed)
[place]. [facility]  Patient:    [name], [name] Visit Number: [id number] MRN: [id number]          Service Type: MED Location: 2300 2302 01 Attending Physician:  Waldo Laine Dictated by:   Gwenith Daily Tyrone Sage, M.D. Proc. Date: 01/23/02 Admit Date:  01/23/2002   CC:         Doylene Canning. Ladona Ridgel, M.D. [facility]   Operative Report  PREOPERATIVE DIAGNOSIS:  Acute myocardial infarction with severe three-vessel disease.  POSTOPERATIVE DIAGNOSIS:  Acute myocardial infarction with severe three-vessel disease with acute inferior and right ventricular infarct.  OPERATION PERFORMED:  Coronary artery bypass grafting x 3 with left internal mammary artery to the left anterior descending coronary artery, reversed saphenous vein graft to the circumflex, reversed saphenous vein to the distal right coronary artery.  SURGEON:  Gwenith Daily. Tyrone Sage, M.D.  FIRST ASSISTANT:  Maxwell Marion, RN, FA  ANESTHESIA:  General.  INDICATIONS FOR PROCEDURE:  The patient is a 76 year old male who began having pain approximately 12 hours prior to his presentation to the hospital.  At first the pain was intermittent but then became constant.  The patient presented to the [facility] emergency department with hypotensive and was transferred to [facility].  When seen in the cath lab he was hypotensive with blood pressure of 70 on Dopamine with intra-aortic balloon pump with plethoric discoloration from his nipple line up in obvious cardiogenic shock.  The patient was neurologic but he awakened able to talk.  Films were reviewed demonstrating greater than 99% stenosis of the proximal LAD, 80 to 90% proximal circumflex and total occlusion of the right coronary artery in the proximal third.  Because of the patients severe three-vessel disease, it was felt that proceeding with emergency coronary artery bypass grafting with intra-aortic balloon pump support and pressors was his most likely chance  of survival.  The patient and his family agreed.  At the time of surgery his creatinine was 2.3.  Both he and his family were aware of the critical nature and the risks of his current situation.  DESCRIPTION OF PROCEDURE:  With Swan-Ganz and arterial line monitors in place, the patient underwent general endotracheal anesthesia with intra-aortic balloon pump support.  He was maintained hemodynamically.  The skin of the chest and legs was prepped with Betadine and draped in the usual sterile manner.  In the operating room we were able to stabilize his blood pressure. He had a transvenous pacing wire in.  Vein was harvested from each lower extremity below the knee.  A median sternotomy was performed and the left internal mammary artery was dissected down as a pedicle graft.  The distal artery was divided and had reasonable flow.  The pericardium was opened.  The patient was systemically heparinized.  The ascending aorta and the right atrium were cannulated in the aortic root.  A bent cardioplegia needle was introduced into the ascending aorta.  The patient was placed on cardiopulmonary bypass at 2.4 L per minute per meter squared.  Sites for anastomosis were selected and dissected out of the epicardium.  The distal right coronary artery was present but was small.  The aortic crossclamp was applied.  500 cc of cold blood potassium cardioplegia was administered with rapid diastolic arrest of the heart.  Attention was turned first to the distal right coronary artery which was opened and was between 1.3 and 1.4 mm in size.  Using a running 7-0 Prolene and distal anastomosis was performed with a segment  of reversed saphenous vein graft.  Attention was then turned to the circumflex coronary artery which was slightly larger.  The vessel was opened and admitted a 1.5 mm probe.  Using a running 7-0 Prolene, a distal anastomosis was performed.  Attention was then turned to the left anterior  descending coronary artery which was opened.  This was a relatively small vessel.  It did admit a 1 mm probe distally and a 1.5 mm proximally.  Using a running 8-0 Prolene a left internal mammary artery was anastomosed to the left anterior descending coronary artery.  With release of the Edwards bulldog on the mammary artery, there was appropriate rise in myocardial septal temperature.  The aortic cross clamp was removed.  Total cross clamp time was 37 minutes.  The patient spontaneously converted to a sinus rhythm.  Partial occlusion clamp was placed on the ascending aorta.  Two punch aortotomies were performed.  Each of the two vein grafts were anastomosed to the ascending aorta.  Air was evacuated from the grafts.  The partial occlusion clamp was removed.  Sites of anastomosis were inspected and free of bleeding.  Because of diffuse oozing, the patient was given platelet transfusion after separation from bypass.  With the intra-aortic balloon pump, with milrinone and dopamine infusions, the patient was weaned from cardiopulmonary bypass without difficulty.  On examination of the heart, there was a reactive hyperemia along the interior wall but he still had obvious element of right ventricular and inferior myocardial infarction which was significant.  He was decannulated in the usual fashion.  Protamine sulfate was administered.  With the operative field hemostatic, two atrial and two ventricular pacing wires were brought out through the chest wall.  With the dopamine infusion, the patient had intermittently returned to a sinus rhythm but still had episodes of complete heart block.  Because of distention of the right ventricle, we were not able to close the pericardium. Mediastinal fat and tissue was closed over the ascending aorta to protect it in the future.  The sternum was closed with #6 stainless steel wire, the fascia was closed with interrupted 0 Vicryl and running 3-0 Vicryl in  the subcutaneous tissue and 4-0 subcuticular stitch in the skin edges.  Dry  dressings were applied.  The sponge and needle counts were reported as correct at completion of the procedure.  The patient tolerated the procedure without obvious complication and was transferred to the surgical intensive care unit for further postoperative care. Dictated by:   Gwenith Daily Tyrone Sage, M.D. Attending Physician:  Waldo Laine DD:  01/25/02 TD:  01/26/02 Job: [id number]

## 2011-04-04 NOTE — Assessment & Plan Note (Signed)
[name]                            [name] NOTE   NAME:[name], [name] GEISEN                     MRN:          [id number]  DATE:07/22/2006                            DOB:          October 02, 1935    PRIMARY CARDIOLOGIST:  [name] Codding, MD   REASON FOR VISIT:  Scheduled return follow-up.   [name] is a 76 year old male, status post anterior wall MI, treated  with subsequent CABG April 2003, last seen here in the clinic in January of  this year.   Since then, the patient continues to do extremely well from a cardiovascular  standpoint with no interim development of signs/symptoms suggestive of  unstable angina pectoris or congestive heart failure.   The patient reports compliance with his medications, has not resumed smoking  tobacco since his heart attack, and has close monitoring of his lipid  profile by Dr. Regino [name].   CURRENT MEDICATIONS:  1. Toprol XL 25 mg daily.  2. Lisinopril 20 mg daily.  3. Lescol XL 80 mg daily.  4. Lasix 20 mg daily.  5. Coated aspirin 81 mg daily.  6. Fish oil twice daily.  7. Allopurinol.   PHYSICAL EXAMINATION:  VITAL SIGNS:  Blood pressure 122/60, pulse 64 and  regular, weight 157.  GENERAL:  A 76 year old male, no apparent distress.  HEENT:  Normocephalic, atraumatic.  NECK:  Palpable carotid pulses without bruits.  LUNGS:  Clear to auscultation in all fields.  CARDIAC:  Regular rate and rhythm (S1, S2), no murmurs, rubs or gallops.  ABDOMEN:  Soft, nontender, with intact bowel sounds.  EXTREMITIES:  Intact pulses with trace pedal edema.  NEUROLOGIC:  No focal deficits.   IMPRESSION:  1. Coronary artery disease.      a.     Status post acute inferior myocardial infarction with right       ventricular involvement with associated cardiogenic shock, March 2003.      b.     Treated with emergent 3-vessel coronary artery bypass graft:       Left internal mammary artery-left anterior  descending artery;       saphenous vein graft-circumflex; saphenous vein graft-right coronary       artery.      c.     Ejection fraction of 40% with inferior wall akinesis by       catheterization; normalized left ventricular function with no wall       motion abnormalities by a follow-up echocardiogram December 2004.  2. Hyperlipidemia.      a.     Followed by Dr. Regino [name].      b.     LDL goal of 70 or less.  3. History of tobacco.  4. Hypertension.   PLAN:  Continue current medication regimen.  Schedule return clinic follow-  up with Dr. Lewayne [name] in 1 year.                                   [name] Serpe, PA-C  [name] Codding, MD,FACC   GS/MedQ  DD:  07/22/2006  DT:  07/23/2006  Job #:  [id number]   cc:   [name], M.D.

## 2011-06-11 ENCOUNTER — Encounter: Payer: Self-pay | Admitting: Cardiology

## 2011-06-19 ENCOUNTER — Encounter: Payer: Self-pay | Admitting: Cardiology

## 2011-07-07 ENCOUNTER — Encounter: Payer: Self-pay | Admitting: Cardiology

## 2011-07-07 ENCOUNTER — Ambulatory Visit (INDEPENDENT_AMBULATORY_CARE_PROVIDER_SITE_OTHER): Payer: Commercial Indemnity | Admitting: Cardiology

## 2011-07-07 VITALS — BP 183/79 | HR 56 | Ht 63.0 in | Wt 158.0 lb

## 2011-07-07 DIAGNOSIS — I2510 Atherosclerotic heart disease of native coronary artery without angina pectoris: Secondary | ICD-10-CM

## 2011-07-07 DIAGNOSIS — I10 Essential (primary) hypertension: Secondary | ICD-10-CM

## 2011-07-07 DIAGNOSIS — I25810 Atherosclerosis of coronary artery bypass graft(s) without angina pectoris: Secondary | ICD-10-CM

## 2011-07-07 MED ORDER — CHLORTHALIDONE 25 MG PO TABS: 25.0000 mg | ORAL_TABLET | Freq: Every day | ORAL | Status: DC

## 2011-07-07 MED ORDER — AMLODIPINE BESYLATE 10 MG PO TABS: 10.0000 mg | ORAL_TABLET | Freq: Every day | ORAL | Status: DC

## 2011-07-07 MED ORDER — SIMVASTATIN 10 MG PO TABS: 10.0000 mg | ORAL_TABLET | Freq: Every day | ORAL | Status: DC

## 2011-07-07 NOTE — Assessment & Plan Note (Signed)
Blood pressure poorly controlled. Will increase amlodipine to 10 mg by mouth daily and change Lasix to chlorthalidone 25 minutes by mouth daily

## 2011-07-07 NOTE — Assessment & Plan Note (Signed)
Ischemic cardiomyopathy with normal ejection fraction continue medical therapy

## 2011-07-07 NOTE — Patient Instructions (Signed)
   Stop Lasix   Change to Chlorthalidone 25mg  daily  Increase Amlodipine to 10mg  daily  Refills given for Simvastatin Your physician wants you to follow up in:  1 year.  You will receive a reminder letter in the mail one-two months in advance.  If you don't receive a letter, please call our office to schedule the follow up appointment

## 2011-07-07 NOTE — Progress Notes (Signed)
HPI The patient is a 76 year old male with history of anterior wall myocardial infarction, status post emergent bypass surgery in April 2003. The patient has however in followup normal ejection fraction. A stress test couple of years ago was also within normal limits. The patient is doing well. He reports no chest pain or shortness of breath orthopnea PND he has no palpitations or syncope. Unfortunately, his blood pressure remains poorly controlled. The patient reports to me that he has run out of some of his medications including his amlodipine. From a cardiovascular standpoint is otherwise stable.  No Known Allergies  Current Outpatient Prescriptions on File Prior to Visit  Medication Sig Dispense Refill  . allopurinol (ZYLOPRIM) 300 MG tablet Take 300 mg by mouth daily.        Marland Kitchen aspirin (ASPIR-LOW) 81 MG EC tablet Take 81 mg by mouth daily.        . folic acid (FOLVITE) 1 MG tablet Take 1 mg by mouth daily.        . Levothyroxine Sodium 50 MCG CAPS Take 1 capsule by mouth daily.       . metoprolol tartrate (LOPRESSOR) 25 MG tablet TAKE ONE-HALF TABLET BY MOUTH TWICE DAILY  90 tablet  3  . Omega-3 Fatty Acids (FISH OIL) 1200 MG CAPS Take 3,600 mg by mouth daily.          Past Medical History  Diagnosis Date  . CAD (coronary artery disease)   . HLD (hyperlipidemia)     mixed  . HTN (hypertension)     unspecified    Past Surgical History  Procedure Date  . Coronary artery bypass graft     No family history on file.  History   Social History  . Marital Status: Married    Spouse Name: N/A    Number of Children: N/A  . Years of Education: N/A   Occupational History  . Not on file.   Social History Main Topics  . Smoking status: Former Smoker -- 1.0 packs/day for 48 years    Types: Cigarettes    Quit date: 11/17/2001  . Smokeless tobacco: Never Used  . Alcohol Use: Not on file  . Drug Use: Not on file  . Sexually Active: Not on file   Other Topics Concern  . Not on  file   Social History Narrative  . No narrative on file   [id number] positives as outlined above. The remainder of the 18  point review of systems is negative  PHYSICAL EXAM BP 183/79  Pulse 56  Ht 5\' 3"  (1.6 m)  Wt 158 lb (71.668 kg)  BMI 27.99 kg/m2  General: Well-developed, well-nourished in no distress Head: Normocephalic and atraumatic Eyes:PERRLA/EOMI intact, conjunctiva and lids normal Ears: No deformity or lesions Mouth:normal dentition, normal posterior pharynx Neck: Supple, no JVD.  No masses, thyromegaly or abnormal cervical nodes Lungs: Normal breath sounds bilaterally without wheezing.  Normal percussion Cardiac: regular rate and rhythm with normal S1 and S2, no S3 or S4.  PMI is normal.  No pathological murmurs Abdomen: Normal bowel sounds, abdomen is soft and nontender without masses, organomegaly or hernias noted.  No hepatosplenomegaly MSK: Back normal, normal gait muscle strength and tone normal Vascular: Pulse is normal in all 4 extremities Extremities: No peripheral pitting edema Neurologic: Alert and oriented x 3 Skin: Intact without lesions or rashes Lymphatics: No significant adenopathy Psychologic: Normal affect   ECG: Normal sinus rhythm and nonspecific ST-T wave changes   ASSESSMENT AND PLAN

## 2011-07-08 ENCOUNTER — Ambulatory Visit: Payer: Self-pay | Admitting: Cardiology

## 2012-03-03 DIAGNOSIS — I10 Essential (primary) hypertension: Secondary | ICD-10-CM | POA: Diagnosis not present

## 2012-03-03 DIAGNOSIS — L259 Unspecified contact dermatitis, unspecified cause: Secondary | ICD-10-CM | POA: Diagnosis not present

## 2012-03-03 DIAGNOSIS — E039 Hypothyroidism, unspecified: Secondary | ICD-10-CM | POA: Diagnosis not present

## 2012-04-09 DIAGNOSIS — E039 Hypothyroidism, unspecified: Secondary | ICD-10-CM | POA: Diagnosis not present

## 2012-04-09 DIAGNOSIS — E119 Type 2 diabetes mellitus without complications: Secondary | ICD-10-CM | POA: Diagnosis not present

## 2012-04-09 DIAGNOSIS — Z125 Encounter for screening for malignant neoplasm of prostate: Secondary | ICD-10-CM | POA: Diagnosis not present

## 2012-04-09 DIAGNOSIS — I10 Essential (primary) hypertension: Secondary | ICD-10-CM | POA: Diagnosis not present

## 2012-07-06 DIAGNOSIS — N471 Phimosis: Secondary | ICD-10-CM | POA: Diagnosis not present

## 2012-07-06 DIAGNOSIS — R972 Elevated prostate specific antigen [PSA]: Secondary | ICD-10-CM | POA: Diagnosis not present

## 2012-07-06 DIAGNOSIS — N478 Other disorders of prepuce: Secondary | ICD-10-CM | POA: Diagnosis not present

## 2012-07-16 ENCOUNTER — Other Ambulatory Visit: Payer: Self-pay | Admitting: Cardiology

## 2012-07-16 ENCOUNTER — Other Ambulatory Visit: Payer: Self-pay | Admitting: *Deleted

## 2012-07-16 MED ORDER — AMLODIPINE BESYLATE 10 MG PO TABS: 10.0000 mg | ORAL_TABLET | Freq: Every day | ORAL | Status: DC

## 2012-07-16 MED ORDER — CHLORTHALIDONE 25 MG PO TABS: 25.0000 mg | ORAL_TABLET | Freq: Every day | ORAL | Status: DC

## 2012-07-26 ENCOUNTER — Ambulatory Visit: Payer: Commercial Indemnity | Admitting: Cardiology

## 2012-07-29 ENCOUNTER — Encounter: Payer: Self-pay | Admitting: *Deleted

## 2012-07-29 ENCOUNTER — Encounter: Payer: Self-pay | Admitting: Cardiology

## 2012-07-29 ENCOUNTER — Ambulatory Visit (INDEPENDENT_AMBULATORY_CARE_PROVIDER_SITE_OTHER): Payer: Commercial Indemnity | Admitting: Cardiology

## 2012-07-29 VITALS — BP 160/72 | HR 61 | Ht 63.0 in | Wt 142.0 lb

## 2012-07-29 DIAGNOSIS — I2510 Atherosclerotic heart disease of native coronary artery without angina pectoris: Secondary | ICD-10-CM | POA: Diagnosis not present

## 2012-07-29 DIAGNOSIS — I25810 Atherosclerosis of coronary artery bypass graft(s) without angina pectoris: Secondary | ICD-10-CM | POA: Diagnosis not present

## 2012-07-29 DIAGNOSIS — E785 Hyperlipidemia, unspecified: Secondary | ICD-10-CM | POA: Diagnosis not present

## 2012-07-29 DIAGNOSIS — I10 Essential (primary) hypertension: Secondary | ICD-10-CM

## 2012-07-29 MED ORDER — SIMVASTATIN 10 MG PO TABS: 10.0000 mg | ORAL_TABLET | Freq: Every day | ORAL | Status: DC

## 2012-07-29 NOTE — Progress Notes (Signed)
HPI The patient presents for follow up of CAD.  Since she was last seen he says he does well. He does some walking at the mall. With this level of activity he does not get any of the chest pressure that was his previous angina.  The patient denies any new symptoms such as chest discomfort, neck or arm discomfort. There has been no new shortness of breath, PND or orthopnea. There have been no reported palpitations, presyncope or syncope.    No Known Allergies  Current Outpatient Prescriptions  Medication Sig Dispense Refill  . allopurinol (ZYLOPRIM) 300 MG tablet Take 300 mg by mouth daily.        Marland Kitchen amLODipine (NORVASC) 10 MG tablet Take 1 tablet (10 mg total) by mouth daily.  30 tablet  6  . aspirin (ASPIR-LOW) 81 MG EC tablet Take 81 mg by mouth daily.        . chlorthalidone (HYGROTON) 25 MG tablet Take 1 tablet (25 mg total) by mouth daily.  30 tablet  6  . fish oil-omega-3 fatty acids 1000 MG capsule Take 2 g by mouth every morning. & 1 at supper      . levothyroxine (SYNTHROID, LEVOTHROID) 50 MCG tablet Take 50 mcg by mouth daily.      . metoprolol tartrate (LOPRESSOR) 25 MG tablet TAKE ONE-HALF TABLET BY MOUTH TWICE DAILY  90 tablet  3  . simvastatin (ZOCOR) 10 MG tablet Take 1 tablet (10 mg total) by mouth daily.  30 tablet  11    Past Medical History  Diagnosis Date  . CAD (coronary artery disease)   . HLD (hyperlipidemia)     mixed  . HTN (hypertension)     unspecified    Past Surgical History  Procedure Date  . Coronary artery bypass graft     ROS:  As stated in the HPI and negative for all other systems.  PHYSICAL EXAM BP 160/72  Pulse 61  Ht 5\' 3"  (1.6 m)  Wt 142 lb (64.411 kg)  BMI 25.15 kg/m2 GENERAL:  Well appearing HEENT:  Pupils equal round and reactive, fundi not visualized, oral mucosa unremarkable, edentulous NECK:  No jugular venous distention, waveform within normal limits, carotid upstroke brisk and symmetric, no bruits, no thyromegaly LYMPHATICS:   No cervical, inguinal adenopathy LUNGS:  Clear to auscultation bilaterally BACK:  No CVA tenderness CHEST: Well healed sternotomy scar. HEART:  PMI not displaced or sustained,S1 and S2 within normal limits, no S3, no S4, no clicks, no rubs, no murmurs ABD:  Flat, positive bowel sounds normal in frequency in pitch, no bruits, no rebound, no guarding, no midline pulsatile mass, no hepatomegaly, no splenomegaly EXT:  2 plus pulses throughout, no edema, no cyanosis no clubbing SKIN:  No rashes no nodules NEURO:  Cranial nerves II through XII grossly intact, motor grossly intact throughout PSYCH:  Cognitively intact, oriented to person place and time  EKG:  Sinus rhythm, rate 54, low-voltage, nonspecific ST-T wave changes (voltage is low but gain on EKG is changes.).  07/29/2012   ASSESSMENT AND PLAN  CAD - It has been 5 years since his last stress test. He has not been 10 years since bypass. He needs stress testing for screening purposes but he says he couldn't walk on a treadmill. Therefore, he will have a [employer].  Dyslipidemia - I will defer to [facility], MD with a goal LDL less than 70 and HDL greater than 40.  Hypertension - He didn't take his  medicines yet this morning. He says his blood pressure is well controlled at home. I discussed with him the goal of less than 140/90. He'll keep and I on this and further adjustments will be based on home readings.

## 2012-07-29 NOTE — Patient Instructions (Addendum)
Your physician recommends that you schedule a follow-up appointment in: 1 year. You will receive a reminder letter in the mail in about 10 months reminding you to call and schedule your appointment. If you don't receive this letter, please contact our office. Your physician recommends that you continue on your current medications as directed. Please refer to the Current Medication list given to you today. Your physician has requested that you have a lexiscan myoview. For further information please visit [email or web address]. Please follow instruction sheet, as given.  

## 2012-08-05 ENCOUNTER — Other Ambulatory Visit: Payer: Self-pay | Admitting: *Deleted

## 2012-08-05 DIAGNOSIS — N400 Enlarged prostate without lower urinary tract symptoms: Secondary | ICD-10-CM | POA: Diagnosis not present

## 2012-08-05 DIAGNOSIS — I2510 Atherosclerotic heart disease of native coronary artery without angina pectoris: Secondary | ICD-10-CM

## 2012-08-05 DIAGNOSIS — R972 Elevated prostate specific antigen [PSA]: Secondary | ICD-10-CM | POA: Diagnosis not present

## 2012-08-10 ENCOUNTER — Encounter (HOSPITAL_COMMUNITY)
Admission: RE | Admit: 2012-08-10 | Discharge: 2012-08-10 | Disposition: A | Discharge status: Home / Self Care | Payer: Medicare Other | Source: Ambulatory Visit | Attending: Cardiology | Admitting: Cardiology

## 2012-08-10 ENCOUNTER — Ambulatory Visit (INDEPENDENT_AMBULATORY_CARE_PROVIDER_SITE_OTHER): Payer: Medicare Other

## 2012-08-10 ENCOUNTER — Encounter (HOSPITAL_COMMUNITY): Payer: Self-pay

## 2012-08-10 DIAGNOSIS — I25810 Atherosclerosis of coronary artery bypass graft(s) without angina pectoris: Secondary | ICD-10-CM

## 2012-08-10 DIAGNOSIS — I2510 Atherosclerotic heart disease of native coronary artery without angina pectoris: Secondary | ICD-10-CM | POA: Insufficient documentation

## 2012-08-10 MED ORDER — TECHNETIUM TC 99M SESTAMIBI - CARDIOLITE
10.0000 | Freq: Once | INTRAVENOUS | Status: AC | PRN
  Administered 2012-08-10: 10:00:00 9.3 via INTRAVENOUS

## 2012-08-10 MED ORDER — TECHNETIUM TC 99M SESTAMIBI - CARDIOLITE
30.0000 | Freq: Once | INTRAVENOUS | Status: AC | PRN
  Administered 2012-08-10: 12:00:00 31 via INTRAVENOUS

## 2012-08-10 NOTE — Progress Notes (Signed)
Stress Lab Nurses Notes - Jeani Hawking  AVA DEGUIRE 08/10/2012 Reason for doing test: CAD Type of test: Steffanie Dunn Nurse performing test: Parke Poisson, RN Nuclear Medicine Tech: Lyndel Pleasure Echo Tech: Not Applicable MD performing test: R. Rothbart Family MD: McGough Test explained and consent signed: yes IV started: 22g jelco, Saline lock flushed, No redness or edema and Saline lock started in radiology Symptoms: None Treatment/Intervention: None Reason test stopped: protocol completed After recovery IV was: Discontinued via X-ray tech and No redness or edema Patient to return to Nuc. Med at : 12:45 Patient discharged: Home Patient's Condition upon discharge was: stable Comments: During test BP 152/60 & HR 77.  Recovery BP 148/60 & HR 72.  No Symptoms noted. Erskine Speed T

## 2012-08-19 ENCOUNTER — Encounter: Payer: Self-pay | Admitting: *Deleted

## 2012-08-25 DIAGNOSIS — Z23 Encounter for immunization: Secondary | ICD-10-CM | POA: Diagnosis not present

## 2012-11-01 ENCOUNTER — Encounter (HOSPITAL_COMMUNITY): Payer: Self-pay | Admitting: Cardiology

## 2012-11-11 DIAGNOSIS — E119 Type 2 diabetes mellitus without complications: Secondary | ICD-10-CM | POA: Diagnosis not present

## 2012-11-11 DIAGNOSIS — H66009 Acute suppurative otitis media without spontaneous rupture of ear drum, unspecified ear: Secondary | ICD-10-CM | POA: Diagnosis not present

## 2012-11-11 DIAGNOSIS — Z6825 Body mass index (BMI) 25.0-25.9, adult: Secondary | ICD-10-CM | POA: Diagnosis not present

## 2013-02-08 ENCOUNTER — Other Ambulatory Visit: Payer: Self-pay | Admitting: Cardiology

## 2013-02-08 MED ORDER — AMLODIPINE BESYLATE 10 MG PO TABS: 10.0000 mg | ORAL_TABLET | Freq: Every day | ORAL | Status: DC

## 2013-03-15 ENCOUNTER — Other Ambulatory Visit: Payer: Self-pay | Admitting: *Deleted

## 2013-03-15 MED ORDER — CHLORTHALIDONE 25 MG PO TABS: 25.0000 mg | ORAL_TABLET | Freq: Every day | ORAL | Status: DC

## 2013-04-05 DIAGNOSIS — L408 Other psoriasis: Secondary | ICD-10-CM | POA: Diagnosis not present

## 2013-04-05 DIAGNOSIS — E785 Hyperlipidemia, unspecified: Secondary | ICD-10-CM | POA: Diagnosis not present

## 2013-04-05 DIAGNOSIS — I10 Essential (primary) hypertension: Secondary | ICD-10-CM | POA: Diagnosis not present

## 2013-04-05 DIAGNOSIS — E1129 Type 2 diabetes mellitus with other diabetic kidney complication: Secondary | ICD-10-CM | POA: Diagnosis not present

## 2013-04-05 DIAGNOSIS — I2510 Atherosclerotic heart disease of native coronary artery without angina pectoris: Secondary | ICD-10-CM | POA: Diagnosis not present

## 2013-07-27 ENCOUNTER — Telehealth: Payer: Self-pay | Admitting: Cardiovascular Disease

## 2013-07-27 NOTE — Telephone Encounter (Signed)
Patient came by wanting to know why he has not gotten back paperwork to have a handicap parking sticker

## 2013-07-28 ENCOUNTER — Encounter: Payer: Self-pay | Admitting: *Deleted

## 2013-07-29 NOTE — Telephone Encounter (Signed)
Left message to return call on spouse cell Harriett Sine).

## 2013-07-29 NOTE — Telephone Encounter (Signed)
Attempted to return call - cell phone gives message that it is not a working number.

## 2013-07-29 NOTE — Telephone Encounter (Signed)
Patient returned call.  Informed him that MD will fill out at his yearly visit.  Scheduled for 08/18/2013 with Dr. Purvis Sheffield.  Last seen September 2013 with Dr. Antoine Poche.  Suggested that he could take to PMD if needed sooner if he had been seen by them more recently than he had here.  Stated he needed his cardiologist to do this & stated he would wait till then.  Patient verbalized understanding.

## 2013-08-17 DIAGNOSIS — Z23 Encounter for immunization: Secondary | ICD-10-CM | POA: Diagnosis not present

## 2013-08-18 ENCOUNTER — Ambulatory Visit (INDEPENDENT_AMBULATORY_CARE_PROVIDER_SITE_OTHER): Payer: Medicare Other | Admitting: Cardiovascular Disease

## 2013-08-18 ENCOUNTER — Encounter: Payer: Self-pay | Admitting: Cardiovascular Disease

## 2013-08-18 VITALS — BP 130/80 | HR 58 | Ht 63.0 in | Wt 147.8 lb

## 2013-08-18 DIAGNOSIS — E785 Hyperlipidemia, unspecified: Secondary | ICD-10-CM

## 2013-08-18 DIAGNOSIS — I2510 Atherosclerotic heart disease of native coronary artery without angina pectoris: Secondary | ICD-10-CM

## 2013-08-18 DIAGNOSIS — I10 Essential (primary) hypertension: Secondary | ICD-10-CM

## 2013-08-18 DIAGNOSIS — I25810 Atherosclerosis of coronary artery bypass graft(s) without angina pectoris: Secondary | ICD-10-CM

## 2013-08-18 DIAGNOSIS — L408 Other psoriasis: Secondary | ICD-10-CM

## 2013-08-18 DIAGNOSIS — L409 Psoriasis, unspecified: Secondary | ICD-10-CM

## 2013-08-18 NOTE — Patient Instructions (Signed)
Continue all current medications. Your physician wants you to follow up in:  1 year.  You will receive a reminder letter in the mail one-two months in advance.  If you don't receive a letter, please call our office to schedule the follow up appointment   

## 2013-08-18 NOTE — Progress Notes (Signed)
Patient ID: [name], male   DOB: August 17, 1935, 78 y.o.   MRN: [id number]       SUBJECTIVE: [name] has a h/o CAD s/p CABG, HTN, and hyperlipidemia. He had a nuclear stress test in 07/2012 which revealed basal inferior and inferoseptal scar, with no evidence of ischemia and normal LV systolic function.  The patient denies any symptoms of chest pain, palpitations, shortness of breath, lightheadedness, dizziness, leg swelling, orthopnea, PND, and syncope.     No Known Allergies  Current Outpatient Prescriptions  Medication Sig Dispense Refill  . allopurinol (ZYLOPRIM) 300 MG tablet Take 300 mg by mouth daily.        Marland Kitchen amLODipine (NORVASC) 10 MG tablet Take 1 tablet (10 mg total) by mouth daily.  30 tablet  6  . aspirin (ASPIR-LOW) 81 MG EC tablet Take 81 mg by mouth daily.        . chlorthalidone (HYGROTON) 25 MG tablet Take 1 tablet (25 mg total) by mouth daily.  30 tablet  6  . fish oil-omega-3 fatty acids 1000 MG capsule Take 2 g by mouth every morning. & 1 at supper      . levothyroxine (SYNTHROID, LEVOTHROID) 50 MCG tablet Take 50 mcg by mouth daily.      . metoprolol tartrate (LOPRESSOR) 25 MG tablet TAKE ONE-HALF TABLET BY MOUTH TWICE DAILY  90 tablet  3  . simvastatin (ZOCOR) 10 MG tablet Take 1 tablet (10 mg total) by mouth daily.  30 tablet  11   No current facility-administered medications for this visit.    Past Medical History  Diagnosis Date  . CAD (coronary artery disease)   . HLD (hyperlipidemia)     mixed  . HTN (hypertension)     unspecified    Past Surgical History  Procedure Laterality Date  . Coronary artery bypass graft      History   Social History  . Marital Status: Married    Spouse Name: N/A    Number of Children: N/A  . Years of Education: N/A   Occupational History  . Not on file.   Social History Main Topics  . Smoking status: Former Smoker -- 1.00 packs/day for 48 years    Types: Cigarettes    Start date: 11/17/1962   Quit date: 11/17/2001  . Smokeless tobacco: Never Used  . Alcohol Use: Not on file  . Drug Use: Not on file  . Sexual Activity: Not on file   Other Topics Concern  . Not on file   Social History Narrative  . No narrative on file     Filed Vitals:   08/18/13 0818  BP: 130/80  Pulse: 58  Height: 5\' 3"  (1.6 m)  Weight: 147 lb 12.8 oz (67.042 kg)    PHYSICAL EXAM General: NAD Neck: No JVD, no thyromegaly or thyroid nodule.  Lungs: Clear to auscultation bilaterally with normal respiratory effort. CV: Nondisplaced PMI.  Heart regular S1/S2, no S3/S4, I/VI systolic murmur at RUSB.  Trace peripheral edema of left leg.  No carotid bruit.  Normal pedal pulses.  Abdomen: Soft, nontender, no hepatosplenomegaly, no distention.  Neurologic: Alert and oriented x 3.  Psych: Normal affect. Extremities: No clubbing or cyanosis. Psoriatic erythema of left leg.  ECG: reviewed and available in electronic records. (nonspecific T wave abnormality inferior leads, old inferior infarct, 0.5-1 mm downsloping ST depression in V2-3, T wave inversions in V2-3 with a nonspecific T wave abnormality in V4-6).      ASSESSMENT  AND PLAN: 1. CAD s/p CABG: symptomatically stable. Stress test one year ago showed no evidence of ischemia. Continue ASA, metoprolol, and simvastatin. 2. HTN: controlled on amlodipine and chlorthalidone. 3. Hyperlipidemia: on statin, followed by PCP. 4. Psoriasis of left leg: encouraged pt to f/u with dermatologist and/or PCP.   Prentice Docker, M.D., F.A.C.C.

## 2013-08-30 ENCOUNTER — Other Ambulatory Visit: Payer: Self-pay | Admitting: Cardiology

## 2013-08-30 MED ORDER — SIMVASTATIN 10 MG PO TABS: 10.0000 mg | ORAL_TABLET | Freq: Every day | ORAL | Status: DC

## 2013-08-30 NOTE — Telephone Encounter (Signed)
refill 

## 2013-11-03 ENCOUNTER — Other Ambulatory Visit: Payer: Self-pay | Admitting: Cardiology

## 2013-11-03 MED ORDER — AMLODIPINE BESYLATE 10 MG PO TABS: 10.0000 mg | ORAL_TABLET | Freq: Every day | ORAL | Status: DC

## 2013-11-15 ENCOUNTER — Other Ambulatory Visit: Payer: Self-pay | Admitting: Cardiology

## 2013-11-15 MED ORDER — CHLORTHALIDONE 25 MG PO TABS: 25.0000 mg | ORAL_TABLET | Freq: Every day | ORAL | Status: DC

## 2014-02-18 IMAGING — NM NM MYOCAR SINGLE W/SPECT W/WALL MOTION & EF
2 series · 12 of 12 positions shown · non-contrast
Comparison: none

nm myoview pharmacologic stress

Ordering Physician: SIMOVIC DUPONT
[place] Physician: [REDACTED]al Data: 76-year-old gentleman with known coronary artery
disease.
NUCLEAR MEDICINE ADENOSINE STRESS MYOVIEW STUDY WITH SPECT AND LEFT
VENTRIUCLAR EJECTION FRACTION
Radionuclide Data: One-day rest/stress protocol performed with
[DATE] mCi of Bc-BBm Myoview.
Stress Data: Regadenoson infusion resulted in no symptoms.  There
was a modest and typical increase in heart rate but no change in
systolic blood pressure with pharmacologic stress.  No significant
arrhythmias noted - occasional PVCs identified.
EKG: Normal sinus rhythm; borderline first-degree AV block; ST-T
wave abnormalities consistent with anterolateral ischemia or LVH;
QT prolongation; slightly delayed R-wave progression.  No
significant change following Regadenoson administration.
Scintigraphic Data: Acquisition technically good.  Left ventricular
size was normal.  On tomographic images reconstructed in standard
planes, there was a small defect of moderate to marked intensity
noted at the base of the  inferior wall and extending
inferoseptally.  By comparison to the resting portion of the study,
no reversibility was apparent.  The gated reconstruction
demonstrated mild dyskinesis of the basilar inferoseptal segment
with normal to hyperdynamic function in a other myocardial regions.
Estimated ejection fraction was 70%.

[Series 1: cardiac rest stress · 6.39mm/px · 6 of 64 frames shown (1 of 2)]
[frame 6/64]
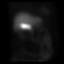
[frame 16/64]
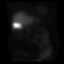
[frame 27/64]
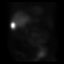
[frame 38/64]
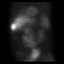
[frame 48/64]
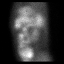
[frame 59/64]
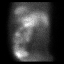

[Series 1: cardiac rest stress · 6.39mm/px · 6 of 512 frames shown (2 of 2)]
[frame 43/512]
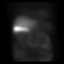
[frame 128/512]
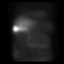
[frame 214/512]
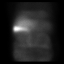
[frame 299/512]
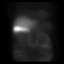
[frame 384/512]
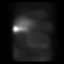
[frame 470/512]
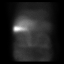

[12 of 12 positions shown; findings below may reference images not displayed]

IMPRESSION: Abnormal pharmacologic stress nuclear myocardial study revealing no
significant stress-induced EKG abnormalities, normal left
ventricular size and overall normal left ventricular systolic
function with a small segmental wall motion abnormality as
described.  By scintigraphic imaging, there is a small basilar
inferior myocardial infarction.  No evidence for ischemia.  Other
findings as noted.

## 2014-05-04 DIAGNOSIS — E039 Hypothyroidism, unspecified: Secondary | ICD-10-CM | POA: Diagnosis not present

## 2014-05-04 DIAGNOSIS — I10 Essential (primary) hypertension: Secondary | ICD-10-CM | POA: Diagnosis not present

## 2014-05-04 DIAGNOSIS — I2510 Atherosclerotic heart disease of native coronary artery without angina pectoris: Secondary | ICD-10-CM | POA: Diagnosis not present

## 2014-05-04 DIAGNOSIS — IMO0002 Reserved for concepts with insufficient information to code with codable children: Secondary | ICD-10-CM | POA: Diagnosis not present

## 2014-05-04 DIAGNOSIS — E119 Type 2 diabetes mellitus without complications: Secondary | ICD-10-CM | POA: Diagnosis not present

## 2014-05-04 DIAGNOSIS — E785 Hyperlipidemia, unspecified: Secondary | ICD-10-CM | POA: Diagnosis not present

## 2014-06-01 DIAGNOSIS — E876 Hypokalemia: Secondary | ICD-10-CM | POA: Diagnosis not present

## 2014-06-27 ENCOUNTER — Other Ambulatory Visit: Payer: Self-pay | Admitting: *Deleted

## 2014-06-27 MED ORDER — AMLODIPINE BESYLATE 10 MG PO TABS: 10.0000 mg | ORAL_TABLET | Freq: Every day | ORAL | Status: DC

## 2014-06-28 ENCOUNTER — Telehealth: Payer: Self-pay | Admitting: Cardiology

## 2014-06-28 NOTE — Telephone Encounter (Signed)
Prescription sent yesterday and [facility]. Pharmacy confirmed that prescription there and ready.

## 2014-06-28 NOTE — Telephone Encounter (Signed)
Patient of Dr. Purvis SheffieldKoneswaran.

## 2014-07-12 ENCOUNTER — Other Ambulatory Visit: Payer: Self-pay | Admitting: *Deleted

## 2014-07-12 MED ORDER — CHLORTHALIDONE 25 MG PO TABS: 25.0000 mg | ORAL_TABLET | Freq: Every day | ORAL | Status: DC

## 2014-07-27 ENCOUNTER — Ambulatory Visit: Payer: Medicare Other | Admitting: Cardiovascular Disease

## 2014-07-31 ENCOUNTER — Ambulatory Visit: Payer: Medicare Other | Admitting: Cardiology

## 2014-08-18 ENCOUNTER — Ambulatory Visit: Payer: Medicare Other | Admitting: Cardiovascular Disease

## 2014-08-22 DIAGNOSIS — Z23 Encounter for immunization: Secondary | ICD-10-CM | POA: Diagnosis not present

## 2014-09-08 ENCOUNTER — Encounter: Payer: Self-pay | Admitting: Cardiovascular Disease

## 2014-09-08 ENCOUNTER — Ambulatory Visit (INDEPENDENT_AMBULATORY_CARE_PROVIDER_SITE_OTHER): Payer: Medicare Other | Admitting: Cardiovascular Disease

## 2014-09-08 VITALS — BP 117/68 | HR 68 | Ht 63.0 in | Wt 137.8 lb

## 2014-09-08 DIAGNOSIS — I10 Essential (primary) hypertension: Secondary | ICD-10-CM | POA: Diagnosis not present

## 2014-09-08 DIAGNOSIS — E785 Hyperlipidemia, unspecified: Secondary | ICD-10-CM | POA: Diagnosis not present

## 2014-09-08 DIAGNOSIS — I25810 Atherosclerosis of coronary artery bypass graft(s) without angina pectoris: Secondary | ICD-10-CM | POA: Diagnosis not present

## 2014-09-08 NOTE — Patient Instructions (Signed)
Continue all current medications. Lab for lipids - Reminder:  Nothing to eat or drink after 12 midnight prior to labs. Office will contact with results via phone or letter.   Your physician wants you to follow up in:  1 year.  You will receive a reminder letter in the mail one-two months in advance.  If you don't receive a letter, please call our office to schedule the follow up appointment

## 2014-09-08 NOTE — Progress Notes (Signed)
Patient ID: [name] CowmanBobby L [name], male   DOB: Nov 19, 1934, 79 y.o.   MRN: [id number]      SUBJECTIVE: [name] has a h/o CAD s/p CABG, HTN, and hyperlipidemia. He had a nuclear stress test in 07/2012 which revealed basal inferior and inferoseptal scar, with no evidence of ischemia and normal LV systolic function. The patient denies any symptoms of chest pain, palpitations, shortness of breath, lightheadedness, dizziness, leg swelling, orthopnea, PND, and syncope. ECG performed in the office today demonstrates normal sinus rhythm with old inferior infarct and precordial T wave inversions, unchanged from an ECG performed one year ago.   Review of Systems: As per "subjective", otherwise negative.  No Known Allergies  Current Outpatient Prescriptions  Medication Sig Dispense Refill  . allopurinol (ZYLOPRIM) 300 MG tablet Take 300 mg by mouth daily.        Marland Kitchen. amLODipine (NORVASC) 10 MG tablet Take 1 tablet (10 mg total) by mouth daily.  30 tablet  3  . aspirin (ASPIR-LOW) 81 MG EC tablet Take 81 mg by mouth daily.        . chlorthalidone (HYGROTON) 25 MG tablet Take 1 tablet (25 mg total) by mouth daily.  30 tablet  6  . fish oil-omega-3 fatty acids 1000 MG capsule Take 2 g by mouth every morning. & 1 at supper      . levothyroxine (SYNTHROID, LEVOTHROID) 50 MCG tablet Take 50 mcg by mouth daily.      . metoprolol tartrate (LOPRESSOR) 25 MG tablet TAKE ONE-HALF TABLET BY MOUTH TWICE DAILY  90 tablet  3  . simvastatin (ZOCOR) 10 MG tablet Take 1 tablet (10 mg total) by mouth daily.  30 tablet  6   No current facility-administered medications for this visit.    Past Medical History  Diagnosis Date  . CAD (coronary artery disease)   . HLD (hyperlipidemia)     mixed  . HTN (hypertension)     unspecified    Past Surgical History  Procedure Laterality Date  . Coronary artery bypass graft      History   Social History  . Marital Status: Married    Spouse Name: N/A    Number of  Children: N/A  . Years of Education: N/A   Occupational History  . Not on file.   Social History Main Topics  . Smoking status: Former Smoker -- 1.00 packs/day for 48 years    Types: Cigarettes    Start date: 11/17/1962    Quit date: 11/17/2001  . Smokeless tobacco: Never Used  . Alcohol Use: Not on file  . Drug Use: Not on file  . Sexual Activity: Not on file   Other Topics Concern  . Not on file   Social History Narrative  . No narrative on file     Filed Vitals:   09/08/14 1330  BP: 117/68  Pulse: 68  Height: 5\' 3"  (1.6 m)  Weight: 137 lb 12.8 oz (62.506 kg)  SpO2: 98%    PHYSICAL EXAM General: NAD HEENT: Normal. Neck: No JVD, no thyromegaly. Lungs: Clear to auscultation bilaterally with normal respiratory effort. CV: Nondisplaced PMI.  Regular rate and rhythm, normal S1/S2, no S3/S4, soft I/VI systolic murmur along left sternal border. No pretibial or periankle edema.  No carotid bruit.  Normal pedal pulses.  Abdomen: Soft, nontender, no hepatosplenomegaly, no distention.  Neurologic: Alert and oriented x 3.  Psych: Normal affect. Skin: Normal. Musculoskeletal: Normal range of motion, no gross deformities. Extremities: No clubbing or  cyanosis.   ECG: Most recent ECG reviewed.      ASSESSMENT AND PLAN: 1. CAD s/p CABG: Stable ischemic heart disease. Stress test one year ago showed no evidence of ischemia. Continue ASA, metoprolol, and simvastatin.  2. Essential HTN: Well controlled on amlodipine and chlorthalidone. No changes. 3. Hyperlipidemia: Will check lipid profile. For the time being, continue simvastatin 10 mg daily.  Dispo: f/u 1 year.  Prentice DockerSuresh Macey Wurtz, M.D., F.A.C.C.

## 2014-11-07 ENCOUNTER — Encounter: Payer: Self-pay | Admitting: *Deleted

## 2014-11-14 ENCOUNTER — Other Ambulatory Visit: Payer: Self-pay | Admitting: *Deleted

## 2014-11-14 MED ORDER — AMLODIPINE BESYLATE 10 MG PO TABS: 10.0000 mg | ORAL_TABLET | Freq: Every day | ORAL | Status: DC

## 2014-11-14 NOTE — Telephone Encounter (Signed)
Amlodipine refilled.

## 2015-03-07 ENCOUNTER — Other Ambulatory Visit: Payer: Self-pay | Admitting: *Deleted

## 2015-03-07 MED ORDER — CHLORTHALIDONE 25 MG PO TABS: 25.0000 mg | ORAL_TABLET | Freq: Every day | ORAL | Status: DC

## 2015-04-09 DIAGNOSIS — Z6824 Body mass index (BMI) 24.0-24.9, adult: Secondary | ICD-10-CM | POA: Diagnosis not present

## 2015-04-09 DIAGNOSIS — E782 Mixed hyperlipidemia: Secondary | ICD-10-CM | POA: Diagnosis not present

## 2015-04-09 DIAGNOSIS — I10 Essential (primary) hypertension: Secondary | ICD-10-CM | POA: Diagnosis not present

## 2015-04-09 DIAGNOSIS — E119 Type 2 diabetes mellitus without complications: Secondary | ICD-10-CM | POA: Diagnosis not present

## 2015-04-09 DIAGNOSIS — I2510 Atherosclerotic heart disease of native coronary artery without angina pectoris: Secondary | ICD-10-CM | POA: Diagnosis not present

## 2015-06-05 DIAGNOSIS — E039 Hypothyroidism, unspecified: Secondary | ICD-10-CM | POA: Diagnosis not present

## 2015-06-05 DIAGNOSIS — Z1389 Encounter for screening for other disorder: Secondary | ICD-10-CM | POA: Diagnosis not present

## 2015-06-05 DIAGNOSIS — Z6824 Body mass index (BMI) 24.0-24.9, adult: Secondary | ICD-10-CM | POA: Diagnosis not present

## 2015-08-21 DIAGNOSIS — Z23 Encounter for immunization: Secondary | ICD-10-CM | POA: Diagnosis not present

## 2015-09-17 ENCOUNTER — Encounter: Payer: Self-pay | Admitting: *Deleted

## 2015-09-19 ENCOUNTER — Ambulatory Visit (INDEPENDENT_AMBULATORY_CARE_PROVIDER_SITE_OTHER): Payer: Medicare Other | Admitting: Cardiovascular Disease

## 2015-09-19 ENCOUNTER — Encounter: Payer: Self-pay | Admitting: Cardiovascular Disease

## 2015-09-19 VITALS — BP 140/74 | HR 59 | Ht 63.0 in | Wt 140.0 lb

## 2015-09-19 DIAGNOSIS — I10 Essential (primary) hypertension: Secondary | ICD-10-CM

## 2015-09-19 DIAGNOSIS — I25810 Atherosclerosis of coronary artery bypass graft(s) without angina pectoris: Secondary | ICD-10-CM | POA: Diagnosis not present

## 2015-09-19 DIAGNOSIS — E785 Hyperlipidemia, unspecified: Secondary | ICD-10-CM | POA: Diagnosis not present

## 2015-09-19 NOTE — Patient Instructions (Signed)

## 2015-09-19 NOTE — Progress Notes (Signed)
Patient ID: [name] CowmanBobby L Sedlak, male   DOB: Oct 21, 1935, 80 y.o.   MRN: [id number]      SUBJECTIVE: [name] has a h/o CAD s/p CABG, essential HTN, and hyperlipidemia.   He had a nuclear stress test in 07/2012 which revealed basal inferior and inferoseptal scar, with no evidence of ischemia and normal LV systolic function.  The patient denies any symptoms of chest pain, palpitations, shortness of breath, lightheadedness, dizziness, leg swelling, orthopnea, PND, and syncope.  Lipids done 04/09/15 showed TC 167, TG 274, LDL 64, HDL 48.   Review of Systems: As per "subjective", otherwise negative.  No Known Allergies  Current Outpatient Prescriptions  Medication Sig Dispense Refill  . allopurinol (ZYLOPRIM) 300 MG tablet Take 300 mg by mouth daily.      Marland Kitchen. amLODipine (NORVASC) 10 MG tablet Take 1 tablet (10 mg total) by mouth daily. 30 tablet 10  . aspirin (ASPIR-LOW) 81 MG EC tablet Take 81 mg by mouth daily.      . chlorthalidone (HYGROTON) 25 MG tablet Take 1 tablet (25 mg total) by mouth daily. 30 tablet 6  . fish oil-omega-3 fatty acids 1000 MG capsule Take 2 g by mouth every morning. & 1 at supper    . levothyroxine (SYNTHROID, LEVOTHROID) 50 MCG tablet Take 50 mcg by mouth daily.    . metoprolol tartrate (LOPRESSOR) 25 MG tablet TAKE ONE-HALF TABLET BY MOUTH TWICE DAILY 90 tablet 3  . simvastatin (ZOCOR) 10 MG tablet Take 1 tablet (10 mg total) by mouth daily. 30 tablet 6   No current facility-administered medications for this visit.    Past Medical History  Diagnosis Date  . CAD (coronary artery disease)   . HLD (hyperlipidemia)     mixed  . HTN (hypertension)     unspecified    Past Surgical History  Procedure Laterality Date  . Coronary artery bypass graft      Social History   Social History  . Marital Status: Married    Spouse Name: N/A  . Number of Children: N/A  . Years of Education: N/A   Occupational History  . Not on file.   Social History Main  Topics  . Smoking status: Former Smoker -- 1.00 packs/day for 48 years    Types: Cigarettes    Start date: 11/17/1962    Quit date: 11/17/2001  . Smokeless tobacco: Never Used  . Alcohol Use: Not on file  . Drug Use: Not on file  . Sexual Activity: Not on file   Other Topics Concern  . Not on file   Social History Narrative     Filed Vitals:   09/19/15 1300  BP: 140/74  Pulse: 59  Height: 5\' 3"  (1.6 m)  Weight: 140 lb (63.504 kg)    PHYSICAL EXAM General: NAD HEENT: Normal. Neck: No JVD, no thyromegaly. Lungs: Clear to auscultation bilaterally with normal respiratory effort. CV: Nondisplaced PMI. Regular rate and rhythm, normal S1/S2, no S3/S4, soft I/VI systolic murmur along left sternal border. No pretibial or periankle edema. No carotid bruit. Abdomen: Soft, nontender, no distention.  Neurologic: Alert and oriented x 3.  Psych: Normal affect. Skin: Normal. Musculoskeletal: Normal range of motion, no gross deformities. Extremities: No clubbing or cyanosis.   ECG: Most recent ECG reviewed.    ASSESSMENT AND PLAN: 1. CAD s/p CABG: Stable ischemic heart disease. Stress test in 2013 showed no evidence of ischemia. Continue ASA, metoprolol, and simvastatin.   2. Essential HTN: Well controlled on amlodipine and  chlorthalidone. No changes.  3. Hyperlipidemia: Lipids reviewed above. Continue simvastatin 10 mg daily.  Dispo: f/u 1 year.   Prentice Docker, M.D., F.A.C.C.

## 2015-10-30 ENCOUNTER — Other Ambulatory Visit: Payer: Self-pay | Admitting: Cardiovascular Disease

## 2015-11-19 ENCOUNTER — Other Ambulatory Visit: Payer: Self-pay | Admitting: Cardiovascular Disease

## 2016-03-12 DIAGNOSIS — Z1389 Encounter for screening for other disorder: Secondary | ICD-10-CM | POA: Diagnosis not present

## 2016-03-12 DIAGNOSIS — E663 Overweight: Secondary | ICD-10-CM | POA: Diagnosis not present

## 2016-03-12 DIAGNOSIS — Z6825 Body mass index (BMI) 25.0-25.9, adult: Secondary | ICD-10-CM | POA: Diagnosis not present

## 2016-03-12 DIAGNOSIS — Z0000 Encounter for general adult medical examination without abnormal findings: Secondary | ICD-10-CM | POA: Diagnosis not present

## 2016-04-24 DIAGNOSIS — Z1389 Encounter for screening for other disorder: Secondary | ICD-10-CM | POA: Diagnosis not present

## 2016-04-24 DIAGNOSIS — Z23 Encounter for immunization: Secondary | ICD-10-CM | POA: Diagnosis not present

## 2016-04-24 DIAGNOSIS — Z125 Encounter for screening for malignant neoplasm of prostate: Secondary | ICD-10-CM | POA: Diagnosis not present

## 2016-04-24 DIAGNOSIS — E039 Hypothyroidism, unspecified: Secondary | ICD-10-CM | POA: Diagnosis not present

## 2016-04-24 DIAGNOSIS — Z6824 Body mass index (BMI) 24.0-24.9, adult: Secondary | ICD-10-CM | POA: Diagnosis not present

## 2016-04-24 DIAGNOSIS — I10 Essential (primary) hypertension: Secondary | ICD-10-CM | POA: Diagnosis not present

## 2016-04-24 DIAGNOSIS — E119 Type 2 diabetes mellitus without complications: Secondary | ICD-10-CM | POA: Diagnosis not present

## 2016-04-24 DIAGNOSIS — E782 Mixed hyperlipidemia: Secondary | ICD-10-CM | POA: Diagnosis not present

## 2016-06-18 DIAGNOSIS — R972 Elevated prostate specific antigen [PSA]: Secondary | ICD-10-CM | POA: Diagnosis not present

## 2016-06-18 DIAGNOSIS — R3915 Urgency of urination: Secondary | ICD-10-CM | POA: Diagnosis not present

## 2016-06-19 DIAGNOSIS — R3915 Urgency of urination: Secondary | ICD-10-CM | POA: Diagnosis not present

## 2016-07-15 DIAGNOSIS — R972 Elevated prostate specific antigen [PSA]: Secondary | ICD-10-CM | POA: Diagnosis not present

## 2016-07-30 DIAGNOSIS — M7989 Other specified soft tissue disorders: Secondary | ICD-10-CM | POA: Diagnosis not present

## 2016-07-30 DIAGNOSIS — I10 Essential (primary) hypertension: Secondary | ICD-10-CM | POA: Diagnosis not present

## 2016-07-30 DIAGNOSIS — R609 Edema, unspecified: Secondary | ICD-10-CM | POA: Diagnosis not present

## 2016-07-30 DIAGNOSIS — M7042 Prepatellar bursitis, left knee: Secondary | ICD-10-CM | POA: Diagnosis not present

## 2016-07-30 DIAGNOSIS — M79605 Pain in left leg: Secondary | ICD-10-CM | POA: Diagnosis not present

## 2016-07-30 DIAGNOSIS — Z87891 Personal history of nicotine dependence: Secondary | ICD-10-CM | POA: Diagnosis not present

## 2016-08-01 DIAGNOSIS — M1712 Unilateral primary osteoarthritis, left knee: Secondary | ICD-10-CM | POA: Diagnosis not present

## 2016-08-01 DIAGNOSIS — Z6824 Body mass index (BMI) 24.0-24.9, adult: Secondary | ICD-10-CM | POA: Diagnosis not present

## 2016-08-04 DIAGNOSIS — R3915 Urgency of urination: Secondary | ICD-10-CM | POA: Diagnosis not present

## 2016-08-04 DIAGNOSIS — R972 Elevated prostate specific antigen [PSA]: Secondary | ICD-10-CM | POA: Diagnosis not present

## 2016-08-19 DIAGNOSIS — Z23 Encounter for immunization: Secondary | ICD-10-CM | POA: Diagnosis not present

## 2016-10-01 ENCOUNTER — Encounter: Payer: Self-pay | Admitting: *Deleted

## 2016-10-02 ENCOUNTER — Ambulatory Visit (INDEPENDENT_AMBULATORY_CARE_PROVIDER_SITE_OTHER): Payer: Medicare Other | Admitting: Cardiovascular Disease

## 2016-10-02 ENCOUNTER — Encounter: Payer: Self-pay | Admitting: Cardiovascular Disease

## 2016-10-02 VITALS — BP 132/62 | HR 53 | Ht 64.0 in | Wt 142.0 lb

## 2016-10-02 DIAGNOSIS — I25810 Atherosclerosis of coronary artery bypass graft(s) without angina pectoris: Secondary | ICD-10-CM

## 2016-10-02 DIAGNOSIS — I10 Essential (primary) hypertension: Secondary | ICD-10-CM

## 2016-10-02 DIAGNOSIS — E7800 Pure hypercholesterolemia, unspecified: Secondary | ICD-10-CM | POA: Diagnosis not present

## 2016-10-02 NOTE — Patient Instructions (Signed)

## 2016-10-02 NOTE — Progress Notes (Signed)
      SUBJECTIVE: [name] has a h/o CAD s/p CABG, essential HTN, and hyperlipidemia.   He had a nuclear stress test in 07/2012 which revealed basal inferior and inferoseptal scar, with no evidence of ischemia and normal LV systolic function.  The patient denies any symptoms of chest pain, palpitations, shortness of breath, lightheadedness, dizziness, leg swelling, orthopnea, PND, and syncope.   Review of Systems: As per "subjective", otherwise negative.  No Known Allergies  Current Outpatient Prescriptions  Medication Sig Dispense Refill  . allopurinol (ZYLOPRIM) 300 MG tablet Take 300 mg by mouth daily.      Marland Kitchen. amLODipine (NORVASC) 10 MG tablet TAKE ONE TABLET BY MOUTH ONCE DAILY 30 tablet 6  . aspirin (ASPIR-LOW) 81 MG EC tablet Take 81 mg by mouth daily.      . chlorthalidone (HYGROTON) 25 MG tablet TAKE ONE TABLET BY MOUTH ONCE DAILY 30 tablet 6  . fish oil-omega-3 fatty acids 1000 MG capsule Take 2 g by mouth every morning. & 1 at supper    . levothyroxine (SYNTHROID, LEVOTHROID) 50 MCG tablet Take 50 mcg by mouth daily.    . metoprolol tartrate (LOPRESSOR) 25 MG tablet TAKE ONE-HALF TABLET BY MOUTH TWICE DAILY 90 tablet 3  . simvastatin (ZOCOR) 10 MG tablet Take 1 tablet (10 mg total) by mouth daily. 30 tablet 6   No current facility-administered medications for this visit.     Past Medical History:  Diagnosis Date  . CAD (coronary artery disease)   . HLD (hyperlipidemia)    mixed  . HTN (hypertension)    unspecified    Past Surgical History:  Procedure Laterality Date  . CORONARY ARTERY BYPASS GRAFT      Social History   Social History  . Marital status: Married    Spouse name: N/A  . Number of children: N/A  . Years of education: N/A   Occupational History  . Not on file.   Social History Main Topics  . Smoking status: Former Smoker    Packs/day: 1.00    Years: 48.00    Types: Cigarettes    Start date: 11/17/1962    Quit date: 11/17/2001  .  Smokeless tobacco: Never Used  . Alcohol use Not on file  . Drug use: Unknown  . Sexual activity: Not on file   Other Topics Concern  . Not on file   Social History Narrative  . No narrative on file     Vitals:   10/02/16 1330  BP: 132/62  Pulse: (!) 53  SpO2: 96%  Weight: 142 lb (64.4 kg)  Height: 5\' 4"  (1.626 m)    PHYSICAL EXAM General: NAD HEENT: Normal. Neck: No JVD, no thyromegaly. Lungs: Clear to auscultation bilaterally with normal respiratory effort. CV: Nondisplaced PMI.  Regular rate and rhythm, normal S1/S2, no S3/S4, no murmur. No pretibial or periankle edema.  No carotid bruit.   Abdomen: Soft, nontender, no distention.  Neurologic: Alert and oriented.  Psych: Normal affect. Skin: Normal. Musculoskeletal: No gross deformities.    ECG: Most recent ECG reviewed.      ASSESSMENT AND PLAN: 1. CAD s/p CABG: Stable ischemic heart disease. Stress test in 2013 showed no evidence of ischemia. Continue ASA, metoprolol, and simvastatin.   2. Essential HTN: Well controlled on amlodipine and chlorthalidone. No changes.  3. Hyperlipidemia: Will obtain copy of lipids from PCP. Continue simvastatin 10 mg daily.  Dispo: f/u 1 year.   Prentice DockerSuresh Koneswaran, M.D., F.A.C.C.

## 2016-10-07 ENCOUNTER — Encounter: Payer: Self-pay | Admitting: *Deleted

## 2016-10-28 DIAGNOSIS — M1000 Idiopathic gout, unspecified site: Secondary | ICD-10-CM | POA: Diagnosis not present

## 2016-10-28 DIAGNOSIS — Z6824 Body mass index (BMI) 24.0-24.9, adult: Secondary | ICD-10-CM | POA: Diagnosis not present

## 2016-10-28 DIAGNOSIS — Z1389 Encounter for screening for other disorder: Secondary | ICD-10-CM | POA: Diagnosis not present

## 2017-02-23 DIAGNOSIS — M1991 Primary osteoarthritis, unspecified site: Secondary | ICD-10-CM | POA: Diagnosis not present

## 2017-02-23 DIAGNOSIS — E782 Mixed hyperlipidemia: Secondary | ICD-10-CM | POA: Diagnosis not present

## 2017-02-23 DIAGNOSIS — M79671 Pain in right foot: Secondary | ICD-10-CM | POA: Diagnosis not present

## 2017-02-23 DIAGNOSIS — Z1389 Encounter for screening for other disorder: Secondary | ICD-10-CM | POA: Diagnosis not present

## 2017-02-23 DIAGNOSIS — M109 Gout, unspecified: Secondary | ICD-10-CM | POA: Diagnosis not present

## 2017-02-23 DIAGNOSIS — I10 Essential (primary) hypertension: Secondary | ICD-10-CM | POA: Diagnosis not present

## 2017-02-23 DIAGNOSIS — M25676 Stiffness of unspecified foot, not elsewhere classified: Secondary | ICD-10-CM | POA: Diagnosis not present

## 2017-02-23 DIAGNOSIS — Z681 Body mass index (BMI) 19 or less, adult: Secondary | ICD-10-CM | POA: Diagnosis not present

## 2017-02-23 DIAGNOSIS — E119 Type 2 diabetes mellitus without complications: Secondary | ICD-10-CM | POA: Diagnosis not present

## 2017-05-11 ENCOUNTER — Other Ambulatory Visit: Payer: Self-pay | Admitting: Cardiovascular Disease

## 2017-05-11 MED ORDER — AMLODIPINE BESYLATE 10 MG PO TABS: 10.0000 mg | ORAL_TABLET | Freq: Every day | ORAL | 6 refills | Status: DC

## 2017-05-11 NOTE — Telephone Encounter (Signed)
Done

## 2017-05-11 NOTE — Telephone Encounter (Signed)
° ° ° °  1. Which medications need to be refilled? (please list name of each medication and dose if known)  AMLODIPINE 10 MG   2. Which pharmacy/location (including street and city if local pharmacy) is medication to be sent to? [facility]   [place], [place]   3. Do they need a 30 day or 90 day supply?    Patient needs refilled today if possible due to only having 2 pills

## 2017-05-19 DIAGNOSIS — R972 Elevated prostate specific antigen [PSA]: Secondary | ICD-10-CM | POA: Diagnosis not present

## 2017-05-19 DIAGNOSIS — Z0000 Encounter for general adult medical examination without abnormal findings: Secondary | ICD-10-CM | POA: Diagnosis not present

## 2017-05-19 DIAGNOSIS — M109 Gout, unspecified: Secondary | ICD-10-CM | POA: Diagnosis not present

## 2017-05-19 DIAGNOSIS — E039 Hypothyroidism, unspecified: Secondary | ICD-10-CM | POA: Diagnosis not present

## 2017-05-19 DIAGNOSIS — E782 Mixed hyperlipidemia: Secondary | ICD-10-CM | POA: Diagnosis not present

## 2017-05-19 DIAGNOSIS — I10 Essential (primary) hypertension: Secondary | ICD-10-CM | POA: Diagnosis not present

## 2017-05-19 DIAGNOSIS — E119 Type 2 diabetes mellitus without complications: Secondary | ICD-10-CM | POA: Diagnosis not present

## 2017-05-19 DIAGNOSIS — I2510 Atherosclerotic heart disease of native coronary artery without angina pectoris: Secondary | ICD-10-CM | POA: Diagnosis not present

## 2017-05-19 DIAGNOSIS — Z125 Encounter for screening for malignant neoplasm of prostate: Secondary | ICD-10-CM | POA: Diagnosis not present

## 2017-05-19 DIAGNOSIS — M1711 Unilateral primary osteoarthritis, right knee: Secondary | ICD-10-CM | POA: Diagnosis not present

## 2017-05-19 DIAGNOSIS — Z1389 Encounter for screening for other disorder: Secondary | ICD-10-CM | POA: Diagnosis not present

## 2017-05-19 DIAGNOSIS — Z6823 Body mass index (BMI) 23.0-23.9, adult: Secondary | ICD-10-CM | POA: Diagnosis not present

## 2017-06-15 DIAGNOSIS — E876 Hypokalemia: Secondary | ICD-10-CM | POA: Diagnosis not present

## 2017-07-09 DIAGNOSIS — M25562 Pain in left knee: Secondary | ICD-10-CM | POA: Diagnosis not present

## 2017-07-09 DIAGNOSIS — R52 Pain, unspecified: Secondary | ICD-10-CM | POA: Diagnosis not present

## 2017-07-09 DIAGNOSIS — Z87891 Personal history of nicotine dependence: Secondary | ICD-10-CM | POA: Diagnosis not present

## 2017-07-09 DIAGNOSIS — M109 Gout, unspecified: Secondary | ICD-10-CM | POA: Diagnosis not present

## 2017-07-09 DIAGNOSIS — I252 Old myocardial infarction: Secondary | ICD-10-CM | POA: Diagnosis not present

## 2017-07-09 DIAGNOSIS — I10 Essential (primary) hypertension: Secondary | ICD-10-CM | POA: Diagnosis not present

## 2017-07-25 DIAGNOSIS — E7800 Pure hypercholesterolemia, unspecified: Secondary | ICD-10-CM | POA: Diagnosis not present

## 2017-07-25 DIAGNOSIS — K219 Gastro-esophageal reflux disease without esophagitis: Secondary | ICD-10-CM | POA: Diagnosis not present

## 2017-07-25 DIAGNOSIS — M1000 Idiopathic gout, unspecified site: Secondary | ICD-10-CM | POA: Diagnosis not present

## 2017-07-25 DIAGNOSIS — Z79899 Other long term (current) drug therapy: Secondary | ICD-10-CM | POA: Diagnosis not present

## 2017-07-25 DIAGNOSIS — M109 Gout, unspecified: Secondary | ICD-10-CM | POA: Diagnosis not present

## 2017-07-25 DIAGNOSIS — M25422 Effusion, left elbow: Secondary | ICD-10-CM | POA: Diagnosis not present

## 2017-07-25 DIAGNOSIS — Z87891 Personal history of nicotine dependence: Secondary | ICD-10-CM | POA: Diagnosis not present

## 2017-07-25 DIAGNOSIS — I10 Essential (primary) hypertension: Secondary | ICD-10-CM | POA: Diagnosis not present

## 2017-07-25 DIAGNOSIS — M25421 Effusion, right elbow: Secondary | ICD-10-CM | POA: Diagnosis not present

## 2017-07-25 DIAGNOSIS — I252 Old myocardial infarction: Secondary | ICD-10-CM | POA: Diagnosis not present

## 2017-08-04 DIAGNOSIS — Z6821 Body mass index (BMI) 21.0-21.9, adult: Secondary | ICD-10-CM | POA: Diagnosis not present

## 2017-08-04 DIAGNOSIS — R0981 Nasal congestion: Secondary | ICD-10-CM | POA: Diagnosis not present

## 2017-08-04 DIAGNOSIS — R05 Cough: Secondary | ICD-10-CM | POA: Diagnosis not present

## 2017-08-25 DIAGNOSIS — Z23 Encounter for immunization: Secondary | ICD-10-CM | POA: Diagnosis not present

## 2017-08-28 DIAGNOSIS — M25561 Pain in right knee: Secondary | ICD-10-CM | POA: Diagnosis not present

## 2017-08-28 DIAGNOSIS — I10 Essential (primary) hypertension: Secondary | ICD-10-CM | POA: Diagnosis not present

## 2017-08-28 DIAGNOSIS — Z87891 Personal history of nicotine dependence: Secondary | ICD-10-CM | POA: Diagnosis not present

## 2017-08-28 DIAGNOSIS — E7800 Pure hypercholesterolemia, unspecified: Secondary | ICD-10-CM | POA: Diagnosis not present

## 2017-08-28 DIAGNOSIS — M7989 Other specified soft tissue disorders: Secondary | ICD-10-CM | POA: Diagnosis not present

## 2017-08-28 DIAGNOSIS — I252 Old myocardial infarction: Secondary | ICD-10-CM | POA: Diagnosis not present

## 2017-08-28 DIAGNOSIS — Z79899 Other long term (current) drug therapy: Secondary | ICD-10-CM | POA: Diagnosis not present

## 2017-10-02 DIAGNOSIS — H40013 Open angle with borderline findings, low risk, bilateral: Secondary | ICD-10-CM | POA: Diagnosis not present

## 2017-10-02 DIAGNOSIS — H40002 Preglaucoma, unspecified, left eye: Secondary | ICD-10-CM | POA: Diagnosis not present

## 2017-10-02 DIAGNOSIS — H40001 Preglaucoma, unspecified, right eye: Secondary | ICD-10-CM | POA: Diagnosis not present

## 2017-10-02 DIAGNOSIS — H2513 Age-related nuclear cataract, bilateral: Secondary | ICD-10-CM | POA: Diagnosis not present

## 2017-10-02 DIAGNOSIS — H25813 Combined forms of age-related cataract, bilateral: Secondary | ICD-10-CM | POA: Diagnosis not present

## 2017-10-02 DIAGNOSIS — H25013 Cortical age-related cataract, bilateral: Secondary | ICD-10-CM | POA: Diagnosis not present

## 2017-10-02 DIAGNOSIS — H43811 Vitreous degeneration, right eye: Secondary | ICD-10-CM | POA: Diagnosis not present

## 2017-10-02 DIAGNOSIS — H524 Presbyopia: Secondary | ICD-10-CM | POA: Diagnosis not present

## 2017-10-12 ENCOUNTER — Ambulatory Visit: Payer: Medicare Other | Admitting: Cardiovascular Disease

## 2017-10-12 DIAGNOSIS — K219 Gastro-esophageal reflux disease without esophagitis: Secondary | ICD-10-CM | POA: Diagnosis not present

## 2017-10-12 DIAGNOSIS — Z7982 Long term (current) use of aspirin: Secondary | ICD-10-CM | POA: Diagnosis not present

## 2017-10-12 DIAGNOSIS — Z87891 Personal history of nicotine dependence: Secondary | ICD-10-CM | POA: Diagnosis not present

## 2017-10-12 DIAGNOSIS — Z79899 Other long term (current) drug therapy: Secondary | ICD-10-CM | POA: Diagnosis not present

## 2017-10-12 DIAGNOSIS — M109 Gout, unspecified: Secondary | ICD-10-CM | POA: Diagnosis not present

## 2017-10-12 DIAGNOSIS — E7800 Pure hypercholesterolemia, unspecified: Secondary | ICD-10-CM | POA: Diagnosis not present

## 2017-10-12 DIAGNOSIS — M25462 Effusion, left knee: Secondary | ICD-10-CM | POA: Diagnosis not present

## 2017-10-12 DIAGNOSIS — I10 Essential (primary) hypertension: Secondary | ICD-10-CM | POA: Diagnosis not present

## 2017-11-11 ENCOUNTER — Ambulatory Visit (INDEPENDENT_AMBULATORY_CARE_PROVIDER_SITE_OTHER): Payer: Medicare Other | Admitting: Cardiovascular Disease

## 2017-11-11 ENCOUNTER — Other Ambulatory Visit: Payer: Self-pay

## 2017-11-11 ENCOUNTER — Encounter: Payer: Self-pay | Admitting: *Deleted

## 2017-11-11 ENCOUNTER — Encounter: Payer: Self-pay | Admitting: Cardiovascular Disease

## 2017-11-11 VITALS — BP 122/68 | HR 56 | Ht 63.0 in | Wt 131.0 lb

## 2017-11-11 DIAGNOSIS — I25708 Atherosclerosis of coronary artery bypass graft(s), unspecified, with other forms of angina pectoris: Secondary | ICD-10-CM | POA: Diagnosis not present

## 2017-11-11 DIAGNOSIS — E7800 Pure hypercholesterolemia, unspecified: Secondary | ICD-10-CM

## 2017-11-11 DIAGNOSIS — I209 Angina pectoris, unspecified: Secondary | ICD-10-CM

## 2017-11-11 DIAGNOSIS — I10 Essential (primary) hypertension: Secondary | ICD-10-CM

## 2017-11-11 NOTE — Progress Notes (Signed)
SUBJECTIVE: The patient presents for routine annual follow-up.  He has a history of coronary artery disease with a history of CABG, hypertension, and hyperlipidemia.  He had a nuclear stress test in 07/2012 which revealed basal inferior and inferoseptal scar, with no evidence of ischemia and normal LV systolic function.  The patient denies any symptoms of chest pain, palpitations, shortness of breath, lightheadedness, dizziness, leg swelling, orthopnea, PND, and syncope.  He said he gets a lot of exercise by walking with his wife outdoors.  He seldom has left knee gout issues.  ECG performed today which I personally interpreted demonstrated sinus bradycardia with undulating artifact.  There were nonspecific T wave abnormalities.     Review of Systems: As per "subjective", otherwise negative.  No Known Allergies  Current Outpatient Medications  Medication Sig Dispense Refill  . allopurinol (ZYLOPRIM) 300 MG tablet Take 300 mg by mouth daily.      Marland Kitchen. amLODipine (NORVASC) 10 MG tablet Take 1 tablet (10 mg total) by mouth daily. 30 tablet 6  . aspirin (ASPIR-LOW) 81 MG EC tablet Take 81 mg by mouth daily.      . chlorthalidone (HYGROTON) 25 MG tablet TAKE ONE TABLET BY MOUTH ONCE DAILY 30 tablet 6  . fish oil-omega-3 fatty acids 1000 MG capsule Take 2 g by mouth every morning. & 1 at supper    . levothyroxine (SYNTHROID, LEVOTHROID) 50 MCG tablet Take 50 mcg by mouth daily.    . metoprolol tartrate (LOPRESSOR) 25 MG tablet TAKE ONE-HALF TABLET BY MOUTH TWICE DAILY 90 tablet 3  . simvastatin (ZOCOR) 10 MG tablet Take 1 tablet (10 mg total) by mouth daily. 30 tablet 6   No current facility-administered medications for this visit.     Past Medical History:  Diagnosis Date  . CAD (coronary artery disease)   . HLD (hyperlipidemia)    mixed  . HTN (hypertension)    unspecified      Social History   Socioeconomic History  . Marital status: Married    Spouse name: Not on  file  . Number of children: Not on file  . Years of education: Not on file  . Highest education level: Not on file  Social Needs  . Financial resource strain: Not on file  . Food insecurity - worry: Not on file  . Food insecurity - inability: Not on file  . Transportation needs - medical: Not on file  . Transportation needs - non-medical: Not on file  Occupational History  . Not on file  Tobacco Use  . Smoking status: Former Smoker    Packs/day: 1.00    Years: 48.00    Pack years: 48.00    Types: Cigarettes    Start date: 11/17/1962    Last attempt to quit: 11/17/2001    Years since quitting: 15.9  . Smokeless tobacco: Never Used  Substance and Sexual Activity  . Alcohol use: Not on file  . Drug use: Not on file  . Sexual activity: Not on file  Other Topics Concern  . Not on file  Social History Narrative  . Not on file     Vitals:   11/11/17 1251  BP: 122/68  Pulse: (!) 56  SpO2: 98%  Weight: 131 lb (59.4 kg)  Height: 5\' 3"  (1.6 m)    Wt Readings from Last 3 Encounters:  11/11/17 131 lb (59.4 kg)  10/02/16 142 lb (64.4 kg)  09/19/15 140 lb (63.5 kg)  PHYSICAL EXAM General: NAD HEENT: Normal. Neck: No JVD, no thyromegaly. Lungs: Clear to auscultation bilaterally with normal respiratory effort. CV: Regular rate and rhythm, normal S1/S2, no S3/S4, no murmur. No pretibial or periankle edema.  No carotid bruit.   Abdomen: Soft, nontender, no distention.  Neurologic: Alert and oriented.  Psych: Normal affect. Skin: Normal. Musculoskeletal: No gross deformities.    ECG: Most recent ECG reviewed.   Labs: No results found for: K, BUN, CREATININE, ALT, TSH, HGB   Lipids: No results found for: LDLCALC, LDLDIRECT, CHOL, TRIG, HDL     ASSESSMENT AND PLAN:  1. CAD s/p CABG: Stable ischemic heart disease. Stress test in 2013 showed no evidence of ischemia. Continue ASA, metoprolol, and simvastatin.   2. Essential HTN: Well controlled on amlodipine and  chlorthalidone. No changes.  3. Hyperlipidemia: Will obtain copy of lipids from PCP. Continue simvastatin 10 mg daily.     Disposition: Follow up 1 year.   Prentice DockerSuresh Koneswaran, M.D., F.A.C.C.

## 2017-11-11 NOTE — Patient Instructions (Signed)

## 2017-12-08 ENCOUNTER — Other Ambulatory Visit: Payer: Self-pay | Admitting: Cardiovascular Disease

## 2017-12-12 DIAGNOSIS — M7989 Other specified soft tissue disorders: Secondary | ICD-10-CM | POA: Diagnosis not present

## 2017-12-12 DIAGNOSIS — R918 Other nonspecific abnormal finding of lung field: Secondary | ICD-10-CM | POA: Diagnosis not present

## 2017-12-12 DIAGNOSIS — I10 Essential (primary) hypertension: Secondary | ICD-10-CM | POA: Diagnosis not present

## 2017-12-12 DIAGNOSIS — R404 Transient alteration of awareness: Secondary | ICD-10-CM | POA: Diagnosis not present

## 2017-12-12 DIAGNOSIS — R531 Weakness: Secondary | ICD-10-CM | POA: Diagnosis not present

## 2017-12-12 DIAGNOSIS — Z87891 Personal history of nicotine dependence: Secondary | ICD-10-CM | POA: Diagnosis not present

## 2017-12-12 DIAGNOSIS — I252 Old myocardial infarction: Secondary | ICD-10-CM | POA: Diagnosis not present

## 2017-12-12 DIAGNOSIS — K219 Gastro-esophageal reflux disease without esophagitis: Secondary | ICD-10-CM | POA: Diagnosis not present

## 2017-12-12 DIAGNOSIS — M25421 Effusion, right elbow: Secondary | ICD-10-CM | POA: Diagnosis not present

## 2017-12-12 DIAGNOSIS — E785 Hyperlipidemia, unspecified: Secondary | ICD-10-CM | POA: Diagnosis not present

## 2017-12-12 DIAGNOSIS — M109 Gout, unspecified: Secondary | ICD-10-CM | POA: Diagnosis not present

## 2017-12-12 DIAGNOSIS — I2510 Atherosclerotic heart disease of native coronary artery without angina pectoris: Secondary | ICD-10-CM | POA: Diagnosis not present

## 2017-12-12 DIAGNOSIS — R2242 Localized swelling, mass and lump, left lower limb: Secondary | ICD-10-CM | POA: Diagnosis not present

## 2017-12-12 DIAGNOSIS — Z951 Presence of aortocoronary bypass graft: Secondary | ICD-10-CM | POA: Diagnosis not present

## 2017-12-12 DIAGNOSIS — R262 Difficulty in walking, not elsewhere classified: Secondary | ICD-10-CM | POA: Diagnosis not present

## 2017-12-12 DIAGNOSIS — M1A9XX0 Chronic gout, unspecified, without tophus (tophi): Secondary | ICD-10-CM | POA: Diagnosis not present

## 2017-12-12 DIAGNOSIS — E876 Hypokalemia: Secondary | ICD-10-CM | POA: Diagnosis not present

## 2017-12-12 DIAGNOSIS — Z7982 Long term (current) use of aspirin: Secondary | ICD-10-CM | POA: Diagnosis not present

## 2017-12-12 DIAGNOSIS — Z79899 Other long term (current) drug therapy: Secondary | ICD-10-CM | POA: Diagnosis not present

## 2017-12-13 DIAGNOSIS — M1A9XX0 Chronic gout, unspecified, without tophus (tophi): Secondary | ICD-10-CM | POA: Diagnosis not present

## 2017-12-13 DIAGNOSIS — K219 Gastro-esophageal reflux disease without esophagitis: Secondary | ICD-10-CM | POA: Diagnosis not present

## 2017-12-13 DIAGNOSIS — E876 Hypokalemia: Secondary | ICD-10-CM | POA: Diagnosis not present

## 2017-12-13 DIAGNOSIS — I10 Essential (primary) hypertension: Secondary | ICD-10-CM | POA: Diagnosis not present

## 2017-12-13 DIAGNOSIS — M109 Gout, unspecified: Secondary | ICD-10-CM | POA: Diagnosis not present

## 2017-12-13 DIAGNOSIS — I2510 Atherosclerotic heart disease of native coronary artery without angina pectoris: Secondary | ICD-10-CM | POA: Diagnosis not present

## 2017-12-30 DIAGNOSIS — Z7982 Long term (current) use of aspirin: Secondary | ICD-10-CM | POA: Diagnosis not present

## 2017-12-30 DIAGNOSIS — M7989 Other specified soft tissue disorders: Secondary | ICD-10-CM | POA: Diagnosis not present

## 2017-12-30 DIAGNOSIS — Z87891 Personal history of nicotine dependence: Secondary | ICD-10-CM | POA: Diagnosis not present

## 2017-12-30 DIAGNOSIS — Z79899 Other long term (current) drug therapy: Secondary | ICD-10-CM | POA: Diagnosis not present

## 2017-12-30 DIAGNOSIS — I252 Old myocardial infarction: Secondary | ICD-10-CM | POA: Diagnosis not present

## 2017-12-30 DIAGNOSIS — M109 Gout, unspecified: Secondary | ICD-10-CM | POA: Diagnosis not present

## 2017-12-30 DIAGNOSIS — I10 Essential (primary) hypertension: Secondary | ICD-10-CM | POA: Diagnosis not present

## 2017-12-30 DIAGNOSIS — K219 Gastro-esophageal reflux disease without esophagitis: Secondary | ICD-10-CM | POA: Diagnosis not present

## 2017-12-30 DIAGNOSIS — M10071 Idiopathic gout, right ankle and foot: Secondary | ICD-10-CM | POA: Diagnosis not present

## 2017-12-30 DIAGNOSIS — E7800 Pure hypercholesterolemia, unspecified: Secondary | ICD-10-CM | POA: Diagnosis not present

## 2018-03-02 DIAGNOSIS — M1991 Primary osteoarthritis, unspecified site: Secondary | ICD-10-CM | POA: Diagnosis not present

## 2018-03-02 DIAGNOSIS — Z1389 Encounter for screening for other disorder: Secondary | ICD-10-CM | POA: Diagnosis not present

## 2018-03-02 DIAGNOSIS — R2233 Localized swelling, mass and lump, upper limb, bilateral: Secondary | ICD-10-CM | POA: Diagnosis not present

## 2018-03-02 DIAGNOSIS — Z681 Body mass index (BMI) 19 or less, adult: Secondary | ICD-10-CM | POA: Diagnosis not present

## 2018-03-03 DIAGNOSIS — K219 Gastro-esophageal reflux disease without esophagitis: Secondary | ICD-10-CM | POA: Diagnosis not present

## 2018-03-03 DIAGNOSIS — Z87891 Personal history of nicotine dependence: Secondary | ICD-10-CM | POA: Diagnosis not present

## 2018-03-03 DIAGNOSIS — M79641 Pain in right hand: Secondary | ICD-10-CM | POA: Diagnosis not present

## 2018-03-03 DIAGNOSIS — Z951 Presence of aortocoronary bypass graft: Secondary | ICD-10-CM | POA: Diagnosis not present

## 2018-03-03 DIAGNOSIS — M109 Gout, unspecified: Secondary | ICD-10-CM | POA: Diagnosis not present

## 2018-03-03 DIAGNOSIS — I252 Old myocardial infarction: Secondary | ICD-10-CM | POA: Diagnosis not present

## 2018-03-03 DIAGNOSIS — I10 Essential (primary) hypertension: Secondary | ICD-10-CM | POA: Diagnosis not present

## 2018-03-03 DIAGNOSIS — E7800 Pure hypercholesterolemia, unspecified: Secondary | ICD-10-CM | POA: Diagnosis not present

## 2018-03-03 DIAGNOSIS — Z7982 Long term (current) use of aspirin: Secondary | ICD-10-CM | POA: Diagnosis not present

## 2018-03-03 DIAGNOSIS — S5012XA Contusion of left forearm, initial encounter: Secondary | ICD-10-CM | POA: Diagnosis not present

## 2018-03-03 DIAGNOSIS — S51812A Laceration without foreign body of left forearm, initial encounter: Secondary | ICD-10-CM | POA: Diagnosis not present

## 2018-03-03 DIAGNOSIS — Z79899 Other long term (current) drug therapy: Secondary | ICD-10-CM | POA: Diagnosis not present

## 2018-04-02 DIAGNOSIS — H11823 Conjunctivochalasis, bilateral: Secondary | ICD-10-CM | POA: Diagnosis not present

## 2018-04-02 DIAGNOSIS — H11153 Pinguecula, bilateral: Secondary | ICD-10-CM | POA: Diagnosis not present

## 2018-04-02 DIAGNOSIS — H2513 Age-related nuclear cataract, bilateral: Secondary | ICD-10-CM | POA: Diagnosis not present

## 2018-04-02 DIAGNOSIS — H25013 Cortical age-related cataract, bilateral: Secondary | ICD-10-CM | POA: Diagnosis not present

## 2018-04-02 DIAGNOSIS — H25813 Combined forms of age-related cataract, bilateral: Secondary | ICD-10-CM | POA: Diagnosis not present

## 2018-04-02 DIAGNOSIS — H40013 Open angle with borderline findings, low risk, bilateral: Secondary | ICD-10-CM | POA: Diagnosis not present

## 2018-06-09 DIAGNOSIS — I10 Essential (primary) hypertension: Secondary | ICD-10-CM | POA: Diagnosis not present

## 2018-06-09 DIAGNOSIS — E119 Type 2 diabetes mellitus without complications: Secondary | ICD-10-CM | POA: Diagnosis not present

## 2018-06-09 DIAGNOSIS — Z0000 Encounter for general adult medical examination without abnormal findings: Secondary | ICD-10-CM | POA: Diagnosis not present

## 2018-06-09 DIAGNOSIS — E782 Mixed hyperlipidemia: Secondary | ICD-10-CM | POA: Diagnosis not present

## 2018-06-09 DIAGNOSIS — E663 Overweight: Secondary | ICD-10-CM | POA: Diagnosis not present

## 2018-06-09 DIAGNOSIS — Z6826 Body mass index (BMI) 26.0-26.9, adult: Secondary | ICD-10-CM | POA: Diagnosis not present

## 2018-06-09 DIAGNOSIS — Z1389 Encounter for screening for other disorder: Secondary | ICD-10-CM | POA: Diagnosis not present

## 2018-06-30 DIAGNOSIS — I5020 Unspecified systolic (congestive) heart failure: Secondary | ICD-10-CM | POA: Diagnosis not present

## 2018-06-30 DIAGNOSIS — I110 Hypertensive heart disease with heart failure: Secondary | ICD-10-CM | POA: Diagnosis not present

## 2018-06-30 DIAGNOSIS — E785 Hyperlipidemia, unspecified: Secondary | ICD-10-CM | POA: Diagnosis not present

## 2018-06-30 DIAGNOSIS — J918 Pleural effusion in other conditions classified elsewhere: Secondary | ICD-10-CM | POA: Diagnosis not present

## 2018-06-30 DIAGNOSIS — Z87891 Personal history of nicotine dependence: Secondary | ICD-10-CM | POA: Diagnosis not present

## 2018-06-30 DIAGNOSIS — I252 Old myocardial infarction: Secondary | ICD-10-CM | POA: Diagnosis not present

## 2018-06-30 DIAGNOSIS — K219 Gastro-esophageal reflux disease without esophagitis: Secondary | ICD-10-CM | POA: Diagnosis not present

## 2018-06-30 DIAGNOSIS — I2510 Atherosclerotic heart disease of native coronary artery without angina pectoris: Secondary | ICD-10-CM | POA: Diagnosis not present

## 2018-06-30 DIAGNOSIS — E039 Hypothyroidism, unspecified: Secondary | ICD-10-CM | POA: Diagnosis not present

## 2018-06-30 DIAGNOSIS — Z951 Presence of aortocoronary bypass graft: Secondary | ICD-10-CM | POA: Diagnosis not present

## 2018-06-30 DIAGNOSIS — R0602 Shortness of breath: Secondary | ICD-10-CM | POA: Diagnosis not present

## 2018-06-30 DIAGNOSIS — J90 Pleural effusion, not elsewhere classified: Secondary | ICD-10-CM | POA: Diagnosis not present

## 2018-06-30 DIAGNOSIS — M109 Gout, unspecified: Secondary | ICD-10-CM | POA: Diagnosis not present

## 2018-06-30 DIAGNOSIS — Z7982 Long term (current) use of aspirin: Secondary | ICD-10-CM | POA: Diagnosis not present

## 2018-06-30 DIAGNOSIS — Z79899 Other long term (current) drug therapy: Secondary | ICD-10-CM | POA: Diagnosis not present

## 2018-06-30 DIAGNOSIS — R0902 Hypoxemia: Secondary | ICD-10-CM | POA: Diagnosis not present

## 2018-07-01 DIAGNOSIS — J449 Chronic obstructive pulmonary disease, unspecified: Secondary | ICD-10-CM | POA: Diagnosis not present

## 2018-07-01 DIAGNOSIS — J90 Pleural effusion, not elsewhere classified: Secondary | ICD-10-CM | POA: Diagnosis not present

## 2018-07-02 DIAGNOSIS — I2510 Atherosclerotic heart disease of native coronary artery without angina pectoris: Secondary | ICD-10-CM | POA: Diagnosis not present

## 2018-07-02 DIAGNOSIS — E039 Hypothyroidism, unspecified: Secondary | ICD-10-CM | POA: Diagnosis not present

## 2018-07-02 DIAGNOSIS — E785 Hyperlipidemia, unspecified: Secondary | ICD-10-CM | POA: Diagnosis not present

## 2018-07-02 DIAGNOSIS — J90 Pleural effusion, not elsewhere classified: Secondary | ICD-10-CM | POA: Diagnosis not present

## 2018-07-02 DIAGNOSIS — R0902 Hypoxemia: Secondary | ICD-10-CM | POA: Diagnosis not present

## 2018-07-02 DIAGNOSIS — I5021 Acute systolic (congestive) heart failure: Secondary | ICD-10-CM | POA: Diagnosis not present

## 2018-07-02 DIAGNOSIS — I10 Essential (primary) hypertension: Secondary | ICD-10-CM | POA: Diagnosis not present

## 2018-07-12 DIAGNOSIS — J9601 Acute respiratory failure with hypoxia: Secondary | ICD-10-CM | POA: Diagnosis not present

## 2018-07-12 DIAGNOSIS — R600 Localized edema: Secondary | ICD-10-CM | POA: Diagnosis not present

## 2018-07-12 DIAGNOSIS — J90 Pleural effusion, not elsewhere classified: Secondary | ICD-10-CM | POA: Diagnosis not present

## 2018-07-12 DIAGNOSIS — Z6825 Body mass index (BMI) 25.0-25.9, adult: Secondary | ICD-10-CM | POA: Diagnosis not present

## 2018-07-14 ENCOUNTER — Encounter: Payer: Self-pay | Admitting: Physician Assistant

## 2018-07-14 NOTE — Progress Notes (Signed)
Cardiology Office Note    Date:  07/15/2018  ID:  [name], DOB 23-Oct-1935, MRN [id number] PCP:  Sharilyn Sites, MD  Cardiologist:  Kate Sable, MD  Chief Complaint: f/u fluid overload  History of Present Illness:  [name] is a 83 y.o. male with history of CAD s/p CABG 2003, HTN, HLD, baseline sinus bradycardia, gout who presents for post-hospital f/u from [facility]. Per records, He had a nuclear stress test in 07/2012 which revealed basal inferior and inferoseptal scar, with no evidence of ischemia and normal LV systolic function. He was recently admitted to [facility] earlier this month. Discharge notes indicate acute on chronic systolic CHF although 2D echo showed normal LVEF 55-60% moderate right ventricular enlargement with moderately decreased contraction, moderately dilated right atrium, normal MV, moderate TR. His CXR had shown a new moderate right pleural effusion. BNP was elevated. He underwent thoracentesis and the fluid was reported to be transudative. There is mention regarding fluid that there were 22 monocytes, 22 mesothelial cells, 8 macrophage cells.  Negative culture growth. Otherwise labs showed glucose 140, BUN 14, Cr 1.07, alk phos 185, AST/ALT OK, K 4.4, Na 143, CO2 25.6, albumin 4.2, troponin minimally elevated at 0.02 (ref range 0.0-0.01). Nurse called over to [facility] to obtain EKG and was informed this was not done. It is not known what his admitting or discharge weight was. The patient and his wife do not know either.  He returns for follow-up today. He is somewhat of an abrupt historian, citing he feels absolutely fine without any dyspnea, orthopnea, edema, chest pain, night sweats or fevers. His weight is up 19lb since last visit here in 2018. He does not have a home scale. He denies any sodium intake but also states occasionally eats ham and bologna sandwiches for dinner.   Past Medical History:  Diagnosis Date  . CAD  (coronary artery disease)    s/p CABG  . Gout   . HLD (hyperlipidemia)    mixed  . HTN (hypertension)    unspecified    Past Surgical History:  Procedure Laterality Date  . CORONARY ARTERY BYPASS GRAFT      Current Medications: Current Meds  Medication Sig  . allopurinol (ZYLOPRIM) 300 MG tablet Take 300 mg by mouth daily.    Marland Kitchen amLODipine (NORVASC) 10 MG tablet TAKE 1 TABLET BY MOUTH ONCE DAILY  . aspirin (ASPIR-LOW) 81 MG EC tablet Take 81 mg by mouth daily.    . fish oil-omega-3 fatty acids 1000 MG capsule Take 2 g by mouth every morning. & 1 at supper  . furosemide (LASIX) 20 MG tablet Take 20 mg by mouth daily.  Marland Kitchen levothyroxine (SYNTHROID, LEVOTHROID) 50 MCG tablet Take 50 mcg by mouth daily.  . metoprolol tartrate (LOPRESSOR) 25 MG tablet TAKE ONE-HALF TABLET BY MOUTH TWICE DAILY  . simvastatin (ZOCOR) 10 MG tablet Take 1 tablet (10 mg total) by mouth daily.    Allergies:   Patient has no known allergies.   Social History   Socioeconomic History  . Marital status: Married    Spouse name: Not on file  . Number of children: Not on file  . Years of education: Not on file  . Highest education level: Not on file  Occupational History  . Not on file  Social Needs  . Financial resource strain: Not on file  . Food insecurity:    Worry: Not on file    Inability: Not on file  . Transportation  needs:    Medical: Not on file    Non-medical: Not on file  Tobacco Use  . Smoking status: Former Smoker    Packs/day: 1.00    Years: 48.00    Pack years: 48.00    Types: Cigarettes    Start date: 11/17/1962    Last attempt to quit: 11/17/2001    Years since quitting: 16.6  . Smokeless tobacco: Never Used  Substance and Sexual Activity  . Alcohol use: Not on file  . Drug use: Not on file  . Sexual activity: Not on file  Lifestyle  . Physical activity:    Days per week: Not on file    Minutes per session: Not on file  . Stress: Not on file  Relationships  . Social  connections:    Talks on phone: Not on file    Gets together: Not on file    Attends religious service: Not on file    Active member of club or organization: Not on file    Attends meetings of clubs or organizations: Not on file    Relationship status: Not on file  Other Topics Concern  . Not on file  Social History Narrative  . Not on file     Family History:  The patient's family history includes Heart attack in his mother; Stroke in his father.  ROS:   Please see the history of present illness. All other systems are reviewed and otherwise negative.    PHYSICAL EXAM:   VS:  BP (!) 126/58   Pulse (!) 59   Ht _0  (1.6 m)   Wt 150 lb (68 kg)   SpO2 95%   BMI 26.57 kg/m   BMI: Body mass index is 26.57 kg/m. GEN: Well nourished, well developed WM, in no acute distress HEENT: normocephalic, atraumatic Neck: + moderately elevated JVD. No carotid bruits or masses Cardiac: RRR; no murmurs, rubs, or gallops, no edema  Respiratory: diminished BS R lung base 1/3 way up. Otherwise clear to auscultation bilaterally, normal work of breathing GI: appears distended/rounded, nontender, nondistended, + BS MS: severe kyphosis Skin: warm and dry, no rash Neuro:  Alert and Oriented x 3, Strength and sensation are intact, follows commands Psych: euthymic mood, full affect  Wt Readings from Last 3 Encounters:  07/15/18 150 lb (68 kg)  11/11/17 131 lb (59.4 kg)  10/02/16 142 lb (64.4 kg)      Studies/Labs Reviewed:   EKG:  EKG was ordered today and personally reviewed by me and demonstrates sinus bradycardia 54bpm, NSIVCD similar to prior, inferior Q waves, nonspecific T wave changes anteriorly  Recent Labs: No results found for requested labs within last 8760 hours.   Lipid Panel No results found for: CHOL, TRIG, HDL, CHOLHDL, VLDL, LDLCALC, LDLDIRECT  Additional studies/ records that were reviewed today include: Summarized above.   ASSESSMENT & PLAN:   1. Pleural effusion  - seems unusual that this would be entirely due to CHF given its unilateral nature. He received thoracentesis. Pathology does not mention any malignant cells (but does not state none seen either). Clinically his pleural effusion has recurred to a degree on exam. His weight is up 19lb since last OV but also seems to be variable in the past up to 158 several years ago. If this has recurred, I would consider referral to pulmonology for further evaluation. Continue Lasix. Check labs. 2. Chronic diastolic CHF- Reviewed 2g sodium restriction, 2L fluid restriction, daily weights with patient. Continue Lasix. F/u labs.  Although weight is up significantly, he does not appear acutely decompensated. His kyphotic posture makes volume assessment challenging. 3. CAD - asymptomatic. EKG appears stable. 4. HTN - controlled. 5. Hyperlipidemia - continue statin. I will defer any consideration of titration to primary cardiology.   Disposition: F/u with Dr. Bronson Ing as scheduled. If med adjustments are made he may need to return sooner. He did not seem thrilled about any further workup.  Medication Adjustments/Labs and Tests Ordered: Current medicines are reviewed at length with the patient today.  Concerns regarding medicines are outlined above. Medication changes, Labs and Tests ordered today are summarized above and listed in the Patient Instructions accessible in Encounters.   Signed, Charlie Pitter, PA-C  07/15/2018 4:05 PM    [facility] Location in [place]. [place], [place] [address] Ph: [phone]; Fax [phone]

## 2018-07-15 ENCOUNTER — Encounter: Payer: Self-pay | Admitting: Physician Assistant

## 2018-07-15 ENCOUNTER — Ambulatory Visit (INDEPENDENT_AMBULATORY_CARE_PROVIDER_SITE_OTHER): Payer: Medicare Other | Admitting: Physician Assistant

## 2018-07-15 ENCOUNTER — Ambulatory Visit (HOSPITAL_COMMUNITY)
Admission: RE | Admit: 2018-07-15 | Discharge: 2018-07-15 | Disposition: A | Discharge status: Home / Self Care | Payer: Medicare Other | Source: Ambulatory Visit | Attending: Physician Assistant | Admitting: Physician Assistant

## 2018-07-15 VITALS — BP 126/58 | HR 59 | Ht 63.0 in | Wt 150.0 lb

## 2018-07-15 DIAGNOSIS — J90 Pleural effusion, not elsewhere classified: Secondary | ICD-10-CM | POA: Insufficient documentation

## 2018-07-15 DIAGNOSIS — I10 Essential (primary) hypertension: Secondary | ICD-10-CM

## 2018-07-15 DIAGNOSIS — I5032 Chronic diastolic (congestive) heart failure: Secondary | ICD-10-CM

## 2018-07-15 DIAGNOSIS — E785 Hyperlipidemia, unspecified: Secondary | ICD-10-CM

## 2018-07-15 DIAGNOSIS — I2510 Atherosclerotic heart disease of native coronary artery without angina pectoris: Secondary | ICD-10-CM

## 2018-07-15 DIAGNOSIS — I517 Cardiomegaly: Secondary | ICD-10-CM | POA: Insufficient documentation

## 2018-07-15 NOTE — Patient Instructions (Signed)
Medication Instructions:  Your physician recommends that you continue on your current medications as directed. Please refer to the Current Medication list given to you today.   Labwork: Your physician recommends that you return for lab work today.    Testing/Procedures: A chest x-ray takes a picture of the organs and structures inside the chest, including the heart, lungs, and blood vessels. This test can show several things, including, whether the heart is enlarges; whether fluid is building up in the lungs; and whether pacemaker / defibrillator leads are still in place.   Follow-Up: Your physician recommends that you schedule a follow-up appointment as scheduled.    Any Other Special Instructions Will Be Listed Below (If Applicable).     If you need a refill on your cardiac medications before your next appointment, please call your pharmacy.  Thank you for choosing [facility]!

## 2018-07-16 ENCOUNTER — Other Ambulatory Visit (HOSPITAL_COMMUNITY)
Admission: RE | Admit: 2018-07-16 | Discharge: 2018-07-16 | Disposition: A | Discharge status: Home / Self Care | Payer: Medicare Other | Source: Ambulatory Visit | Attending: Physician Assistant | Admitting: Physician Assistant

## 2018-07-16 DIAGNOSIS — I2510 Atherosclerotic heart disease of native coronary artery without angina pectoris: Secondary | ICD-10-CM | POA: Insufficient documentation

## 2018-07-16 DIAGNOSIS — I110 Hypertensive heart disease with heart failure: Secondary | ICD-10-CM | POA: Insufficient documentation

## 2018-07-16 DIAGNOSIS — E785 Hyperlipidemia, unspecified: Secondary | ICD-10-CM | POA: Insufficient documentation

## 2018-07-16 DIAGNOSIS — J90 Pleural effusion, not elsewhere classified: Secondary | ICD-10-CM | POA: Insufficient documentation

## 2018-07-16 DIAGNOSIS — I5032 Chronic diastolic (congestive) heart failure: Secondary | ICD-10-CM | POA: Insufficient documentation

## 2018-07-16 LAB — CBC WITH DIFFERENTIAL/PLATELET
BASOS ABS: 0.1 10*3/uL (ref 0.0–0.1)
BASOS PCT: 1 %
EOS PCT: 8 %
Eosinophils Absolute: 0.5 10*3/uL (ref 0.0–0.7)
HEMATOCRIT: 35.8 % — AB (ref 39.0–52.0)
Hemoglobin: 11.7 g/dL — ABNORMAL LOW (ref 13.0–17.0)
LYMPHS PCT: 22 %
Lymphs Abs: 1.4 10*3/uL (ref 0.7–4.0)
MCH: 29.5 pg (ref 26.0–34.0)
MCHC: 32.7 g/dL (ref 30.0–36.0)
MCV: 90.2 fL (ref 78.0–100.0)
MONOS PCT: 9 %
Monocytes Absolute: 0.6 10*3/uL (ref 0.1–1.0)
NEUTROS ABS: 4 10*3/uL (ref 1.7–7.7)
Neutrophils Relative %: 60 %
Platelets: 282 10*3/uL (ref 150–400)
RBC: 3.97 MIL/uL — ABNORMAL LOW (ref 4.22–5.81)
RDW: 16.8 % — ABNORMAL HIGH (ref 11.5–15.5)
WBC: 6.5 10*3/uL (ref 4.0–10.5)

## 2018-07-16 LAB — COMPREHENSIVE METABOLIC PANEL
ALBUMIN: 3.7 g/dL (ref 3.5–5.0)
ALK PHOS: 157 U/L — AB (ref 38–126)
ALT: 14 U/L (ref 0–44)
AST: 24 U/L (ref 15–41)
Anion gap: 11 (ref 5–15)
BILIRUBIN TOTAL: 1.6 mg/dL — AB (ref 0.3–1.2)
BUN: 14 mg/dL (ref 8–23)
CALCIUM: 9.7 mg/dL (ref 8.9–10.3)
CO2: 26 mmol/L (ref 22–32)
Chloride: 106 mmol/L (ref 98–111)
Creatinine, Ser: 1.06 mg/dL (ref 0.61–1.24)
GFR calc Af Amer: >60 mL/min (ref >60)
GFR calc non Af Amer: >60 mL/min (ref >60)
GLUCOSE: 93 mg/dL (ref 70–99)
Potassium: 3.8 mmol/L (ref 3.5–5.1)
SODIUM: 143 mmol/L (ref 135–145)
TOTAL PROTEIN: 7.9 g/dL (ref 6.5–8.1)

## 2018-07-16 LAB — TSH: TSH: 10.923 u[IU]/mL — AB (ref 0.350–4.500)

## 2018-07-16 LAB — BRAIN NATRIURETIC PEPTIDE: B NATRIURETIC PEPTIDE 5: 429 pg/mL — AB (ref 0.0–100.0)

## 2018-07-20 ENCOUNTER — Telehealth: Payer: Self-pay

## 2018-07-20 DIAGNOSIS — J90 Pleural effusion, not elsewhere classified: Secondary | ICD-10-CM

## 2018-07-20 DIAGNOSIS — Z79899 Other long term (current) drug therapy: Secondary | ICD-10-CM

## 2018-07-20 MED ORDER — POTASSIUM CHLORIDE CRYS ER 20 MEQ PO TBCR: 20.0000 meq | EXTENDED_RELEASE_TABLET | Freq: Every day | ORAL | 3 refills | Status: DC

## 2018-07-20 MED ORDER — AMLODIPINE BESYLATE 10 MG PO TABS: 5.0000 mg | ORAL_TABLET | Freq: Every day | ORAL | 3 refills | Status: DC

## 2018-07-20 MED ORDER — FUROSEMIDE 20 MG PO TABS: 40.0000 mg | ORAL_TABLET | Freq: Every day | ORAL | 3 refills | Status: DC

## 2018-07-20 NOTE — Telephone Encounter (Signed)
I called pt's pharmacy and they gave me pt phone number [phone] which also rings busy

## 2018-07-20 NOTE — Telephone Encounter (Signed)
Attempt to reach patient and wife, line is busy x 2

## 2018-07-20 NOTE — Telephone Encounter (Signed)
-----  Message from Charlie Pitter, [place] sent at 07/16/2018 12:27 PM EDT ----- Please call patient. Chest Xray suggests recurrence of pleural effusion on the right side. I reviewed with Dr. Domenic Polite (DOD) who agrees there is concern for an alternative process going on given the unilateral nature and normal EF. With his RV dysfunction would be concerned about possibility of a process going on in his lungs to cause this. He was not tachycardic, tachypneic or hypoxic at rest OV. His BNP is up slightly.   Plan:  - Increase Lasix to '40mg'$  daily with repeat BMET/BNP in 1 week, but he needs further pulmonary eval to get to the bottom of why suddenly getting fluid in that lung. Please arrange pulm eval ASAP. - Since we are increasing Lasix, please decrease amlodipine to '5mg'$  daily - Also would suggest he start KCl 83mq daily. - Need to go over these instructions very carefully with patient and possibly wife to make sure they understand. He was very stoic during the [place] - There are other abnormalities including abnormal liver function (alk phos and Total bilirubin) and mild anemia that will need attention by PCP - please send a copy and request pt f/u there very soon as well. - Have him f/u with our office in 2-3 weeks with APP visit to revisit Lasix dosing, keep visit with KBronson Ingas planned  DMelina CopaPA-C

## 2018-07-22 NOTE — Telephone Encounter (Signed)
Wife received letter, wife provided correct phone numbers for both pt and her.   She repeated med change instructions back to me, asks I change lab slip to [facility] lab, I will mail to her, and is aware of f/u apt

## 2018-08-03 DIAGNOSIS — J90 Pleural effusion, not elsewhere classified: Secondary | ICD-10-CM | POA: Diagnosis not present

## 2018-08-03 DIAGNOSIS — Z79899 Other long term (current) drug therapy: Secondary | ICD-10-CM | POA: Diagnosis not present

## 2018-08-13 ENCOUNTER — Telehealth: Payer: Self-pay | Admitting: *Deleted

## 2018-08-13 ENCOUNTER — Ambulatory Visit: Payer: Medicare Other | Admitting: Physician Assistant

## 2018-08-13 NOTE — Telephone Encounter (Signed)
Called patient with test results. No answer. Left message to call back.  

## 2018-08-13 NOTE — Telephone Encounter (Signed)
Referral faxed to Dr. Juanetta Gosling office.

## 2018-08-13 NOTE — Telephone Encounter (Signed)
-----   Message from Laurann [place], PA-C sent at 08/04/2018  7:34 AM EDT ----- Labs stable, was supposed to also have BNP but does not appear to have been done. Please make sure pulm appt is pending. Dayna Dunn PA-C

## 2018-08-17 NOTE — Telephone Encounter (Signed)
Appt with Dr. Juanetta Gosling 10/8

## 2018-08-23 DIAGNOSIS — Z23 Encounter for immunization: Secondary | ICD-10-CM | POA: Diagnosis not present

## 2018-09-21 ENCOUNTER — Ambulatory Visit: Payer: Medicare Other | Admitting: Student

## 2018-09-21 NOTE — Progress Notes (Deleted)
Cardiology Office Note    Date:  09/21/2018   ID:  [name], DOB April 27, 1935, MRN [id number]  PCP:  Assunta Found, MD  Cardiologist: Prentice Docker, MD    No chief complaint on file.   History of Present Illness:    [name] is a 83 y.o. male ***    Past Medical History:  Diagnosis Date  . CAD (coronary artery disease)    s/p CABG  . Gout   . HLD (hyperlipidemia)    mixed  . HTN (hypertension)    unspecified    Past Surgical History:  Procedure Laterality Date  . CORONARY ARTERY BYPASS GRAFT      Current Medications: Outpatient Medications Prior to Visit  Medication Sig Dispense Refill  . allopurinol (ZYLOPRIM) 300 MG tablet Take 300 mg by mouth daily.      Marland Kitchen amLODipine (NORVASC) 10 MG tablet Take 0.5 tablets (5 mg total) by mouth daily. 90 tablet 3  . aspirin (ASPIR-LOW) 81 MG EC tablet Take 81 mg by mouth daily.      . fish oil-omega-3 fatty acids 1000 MG capsule Take 2 g by mouth every morning. & 1 at supper    . furosemide (LASIX) 20 MG tablet Take 2 tablets (40 mg total) by mouth daily. 30 tablet 3  . levothyroxine (SYNTHROID, LEVOTHROID) 50 MCG tablet Take 50 mcg by mouth daily.    . metoprolol tartrate (LOPRESSOR) 25 MG tablet TAKE ONE-HALF TABLET BY MOUTH TWICE DAILY 90 tablet 3  . potassium chloride SA (K-DUR,KLOR-CON) 20 MEQ tablet Take 1 tablet (20 mEq total) by mouth daily. 90 tablet 3  . simvastatin (ZOCOR) 10 MG tablet Take 1 tablet (10 mg total) by mouth daily. 30 tablet 6   No facility-administered medications prior to visit.      Allergies:   Patient has no known allergies.   Social History   Socioeconomic History  . Marital status: Married    Spouse name: Not on file  . Number of children: Not on file  . Years of education: Not on file  . Highest education level: Not on file  Occupational History  . Not on file  Social Needs  . Financial resource strain: Not on file  . Food insecurity:    Worry: Not on file   Inability: Not on file  . Transportation needs:    Medical: Not on file    Non-medical: Not on file  Tobacco Use  . Smoking status: Former Smoker    Packs/day: 1.00    Years: 48.00    Pack years: 48.00    Types: Cigarettes    Start date: 11/17/1962    Last attempt to quit: 11/17/2001    Years since quitting: 16.8  . Smokeless tobacco: Never Used  Substance and Sexual Activity  . Alcohol use: Not on file  . Drug use: Not on file  . Sexual activity: Not on file  Lifestyle  . Physical activity:    Days per week: Not on file    Minutes per session: Not on file  . Stress: Not on file  Relationships  . Social connections:    Talks on phone: Not on file    Gets together: Not on file    Attends religious service: Not on file    Active member of club or organization: Not on file    Attends meetings of clubs or organizations: Not on file    Relationship status: Not on file  Other Topics  Concern  . Not on file  Social History Narrative  . Not on file     Family History:  The patient's ***family history includes Heart attack in his mother; Stroke in his father.   Review of Systems:   Please see the history of present illness.     General:  No chills, fever, night sweats or weight changes.  Cardiovascular:  No chest pain, dyspnea on exertion, edema, orthopnea, palpitations, paroxysmal nocturnal dyspnea. Dermatological: No rash, lesions/masses Respiratory: No cough, dyspnea Urologic: No hematuria, dysuria Abdominal:   No nausea, vomiting, diarrhea, bright red blood per rectum, melena, or hematemesis Neurologic:  No visual changes, wkns, changes in mental status. All other systems reviewed and are otherwise negative except as noted above.   Physical Exam:    VS:  There were no vitals taken for this visit.   General: Well developed, well nourished,male appearing in no acute distress. Head: Normocephalic, atraumatic, sclera non-icteric, no xanthomas, nares are without discharge.    Neck: No carotid bruits. JVD not elevated.  Lungs: Respirations regular and unlabored, without wheezes or rales.  Heart: ***Regular rate and rhythm. No S3 or S4.  No murmur, no rubs, or gallops appreciated. Abdomen: Soft, non-tender, non-distended with normoactive bowel sounds. No hepatomegaly. No rebound/guarding. No obvious abdominal masses. Msk:  Strength and tone appear normal for age. No joint deformities or effusions. Extremities: No clubbing or cyanosis. No edema.  Distal pedal pulses are 2+ bilaterally. Neuro: Alert and oriented X 3. Moves all extremities spontaneously. No focal deficits noted. Psych:  Responds to questions appropriately with a normal affect. Skin: No rashes or lesions noted  Wt Readings from Last 3 Encounters:  07/15/18 150 lb (68 kg)  11/11/17 131 lb (59.4 kg)  10/02/16 142 lb (64.4 kg)        Studies/Labs Reviewed:   EKG:  EKG is*** ordered today.  The ekg ordered today demonstrates ***  Recent Labs: 07/16/2018: ALT 14; B Natriuretic Peptide 429.0; BUN 14; Creatinine, Ser 1.06; Hemoglobin 11.7; Platelets 282; Potassium 3.8; Sodium 143; TSH 10.923   Lipid Panel No results found for: CHOL, TRIG, HDL, CHOLHDL, VLDL, LDLCALC, LDLDIRECT  Additional studies/ records that were reviewed today include:   Echocardiogram: 06/2018   Assessment:    No diagnosis found.   Plan:   In order of problems listed above:  1. ***    Medication Adjustments/Labs and Tests Ordered: Current medicines are reviewed at length with the patient today.  Concerns regarding medicines are outlined above.  Medication changes, Labs and Tests ordered today are listed in the Patient Instructions below. There are no Patient Instructions on file for this visit.   Signed, Ellsworth Lennox, PA-C  09/21/2018 10:13 AM    [facility] [address] [place], [place] [address] Phone: [phone]

## 2018-10-22 ENCOUNTER — Telehealth: Payer: Self-pay | Admitting: Cardiovascular Disease

## 2018-10-22 MED ORDER — FUROSEMIDE 20 MG PO TABS: 40.0000 mg | ORAL_TABLET | Freq: Every day | ORAL | 3 refills | Status: DC

## 2018-10-22 NOTE — Telephone Encounter (Signed)
Needing refill on furosemide (LASIX) 20 MG tablet [[id number]][[id number]]  [facility]

## 2018-10-22 NOTE — Telephone Encounter (Signed)
Done

## 2018-10-27 DIAGNOSIS — I5032 Chronic diastolic (congestive) heart failure: Secondary | ICD-10-CM | POA: Insufficient documentation

## 2018-10-27 DIAGNOSIS — J90 Pleural effusion, not elsewhere classified: Secondary | ICD-10-CM | POA: Insufficient documentation

## 2018-10-27 NOTE — Progress Notes (Deleted)
Cardiology Office Note    Date:  10/27/2018   ID:  [name], DOB August 03, 1935, MRN [id number]  PCP:  Assunta FoundGolding, John, MD  Cardiologist: Prentice DockerSuresh Koneswaran, MD EPS: None  No chief complaint on file.   History of Present Illness:  [name] is a 83 y.o. male with history of CAD status post CABG in 2003, hypertension, HLD, sinus bradycardia.  [facility] with acute on chronic systolic CHF although echo showed LVEF 55 to 60% with moderate RV enlargement and moderate Lee decreased contractility, moderate dilated right atrium moderate TR.  Chest x-ray showed new moderate right pleural effusion and BNP was elevated.  He underwent thoracentesis that was reported as transudate of.  Patient saw [name], PA-C 07/15/2018 at which time she felt like his effusion had returned to some degree.  His weight was up 19 pounds.  Lasix was increased to 40 mg daily and he was referred to Dr. Juanetta GoslingHawkins for work-up of pleural effusion.  Past Medical History:  Diagnosis Date  . CAD (coronary artery disease)    s/p CABG  . Gout   . HLD (hyperlipidemia)    mixed  . HTN (hypertension)    unspecified    Past Surgical History:  Procedure Laterality Date  . CORONARY ARTERY BYPASS GRAFT      Current Medications: No outpatient medications have been marked as taking for the 11/01/18 encounter (Appointment) with Dyann KiefLenze, Michele M, PA-C.     Allergies:   Patient has no known allergies.   Social History   Socioeconomic History  . Marital status: Married    Spouse name: Not on file  . Number of children: Not on file  . Years of education: Not on file  . Highest education level: Not on file  Occupational History  . Not on file  Social Needs  . Financial resource strain: Not on file  . Food insecurity:    Worry: Not on file    Inability: Not on file  . Transportation needs:    Medical: Not on file    Non-medical: Not on file  Tobacco Use  . Smoking  status: Former Smoker    Packs/day: 1.00    Years: 48.00    Pack years: 48.00    Types: Cigarettes    Start date: 11/17/1962    Last attempt to quit: 11/17/2001    Years since quitting: 16.9  . Smokeless tobacco: Never Used  Substance and Sexual Activity  . Alcohol use: Not on file  . Drug use: Not on file  . Sexual activity: Not on file  Lifestyle  . Physical activity:    Days per week: Not on file    Minutes per session: Not on file  . Stress: Not on file  Relationships  . Social connections:    Talks on phone: Not on file    Gets together: Not on file    Attends religious service: Not on file    Active member of club or organization: Not on file    Attends meetings of clubs or organizations: Not on file    Relationship status: Not on file  Other Topics Concern  . Not on file  Social History Narrative  . Not on file     Family History:  The patient's ***family history includes Heart attack in his mother; Stroke in his father.   ROS:   Please see the history of present illness.    ROS All  other systems reviewed and are negative.   PHYSICAL EXAM:   VS:  There were no vitals taken for this visit.  Physical Exam  GEN: Well nourished, well developed, in no acute distress  HEENT: normal  Neck: no JVD, carotid bruits, or masses Cardiac:RRR; no murmurs, rubs, or gallops  Respiratory:  clear to auscultation bilaterally, normal work of breathing GI: soft, nontender, nondistended, + BS Ext: without cyanosis, clubbing, or edema, Good distal pulses bilaterally MS: no deformity or atrophy  Skin: warm and dry, no rash Neuro:  Alert and Oriented x 3, Strength and sensation are intact Psych: euthymic mood, full affect  Wt Readings from Last 3 Encounters:  07/15/18 150 lb (68 kg)  11/11/17 131 lb (59.4 kg)  10/02/16 142 lb (64.4 kg)      Studies/Labs Reviewed:   EKG:  EKG is*** ordered today.  The ekg ordered today demonstrates ***  Recent Labs: 07/16/2018: ALT 14; B  Natriuretic Peptide 429.0; BUN 14; Creatinine, Ser 1.06; Hemoglobin 11.7; Platelets 282; Potassium 3.8; Sodium 143; TSH 10.923   Lipid Panel No results found for: CHOL, TRIG, HDL, CHOLHDL, VLDL, LDLCALC, LDLDIRECT  Additional studies/ records that were reviewed today include:  ***    ASSESSMENT:    1. 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 Essential hypertension   3. Hyperlipidemia, unspecified hyperlipidemia type   4. Pleural effusion   5. Chronic diastolic CHF (congestive heart failure) (HCC)      PLAN:  In order of problems listed above:  CAD status post CABG in 2003  Chronic diastolic CHF recent echo at [facility] showed normal LV function with moderate RV enlargement and moderately decreased contractility Lasix increased to 40 mg daily in August for BNP of 429  Right pleural effusion referred to Dr. Juanetta Gosling  Essential hypertension    Medication Adjustments/Labs and Tests Ordered: Current medicines are reviewed at length with the patient today.  Concerns regarding medicines are outlined above.  Medication changes, Labs and Tests ordered today are listed in the Patient Instructions below. There are no Patient Instructions on file for this visit.   Signed, Jacolyn Reedy, PA-C  10/27/2018 3:11 PM    [facility] [address] [place], [place], [place]  [address] Phone: [phone]; Fax: [phone]

## 2018-11-01 ENCOUNTER — Ambulatory Visit: Payer: Medicare Other | Admitting: Physician Assistant

## 2018-11-02 ENCOUNTER — Encounter: Payer: Self-pay | Admitting: Physician Assistant

## 2018-11-16 ENCOUNTER — Ambulatory Visit (INDEPENDENT_AMBULATORY_CARE_PROVIDER_SITE_OTHER): Payer: Medicare Other | Admitting: Cardiovascular Disease

## 2018-11-16 ENCOUNTER — Encounter: Payer: Self-pay | Admitting: Cardiovascular Disease

## 2018-11-16 VITALS — BP 142/64 | HR 65 | Ht 63.0 in | Wt 130.8 lb

## 2018-11-16 DIAGNOSIS — I25812 Atherosclerosis of bypass graft of coronary artery of transplanted heart without angina pectoris: Secondary | ICD-10-CM | POA: Diagnosis not present

## 2018-11-16 DIAGNOSIS — I10 Essential (primary) hypertension: Secondary | ICD-10-CM | POA: Diagnosis not present

## 2018-11-16 DIAGNOSIS — I5032 Chronic diastolic (congestive) heart failure: Secondary | ICD-10-CM

## 2018-11-16 DIAGNOSIS — I25708 Atherosclerosis of coronary artery bypass graft(s), unspecified, with other forms of angina pectoris: Secondary | ICD-10-CM | POA: Diagnosis not present

## 2018-11-16 DIAGNOSIS — J90 Pleural effusion, not elsewhere classified: Secondary | ICD-10-CM

## 2018-11-16 DIAGNOSIS — E785 Hyperlipidemia, unspecified: Secondary | ICD-10-CM

## 2018-11-16 NOTE — Patient Instructions (Signed)

## 2018-11-16 NOTE — Progress Notes (Signed)
SUBJECTIVE: The patient presents for routine follow-up.  He has a history of coronary artery disease with a history of CABG, chronic diastolic heart failure, hypertension, and hyperlipidemia.  He was hospitalized in August 2019 at [facility].  Echocardiogram at that time demonstrated normal left ventricular systolic function, LVEF 55 to 16%60%, moderate right ventricular enlargement with moderately decreased contraction, moderately dilated right atrium, and moderate tricuspid regurgitation.  He has a history of right sided pleural effusion and follows with pulmonary.  He had a transudate of pleural effusion when hospitalized in August and underwent thoracentesis.  Chest x-ray on 07/16/2018 showed increasing subpulmonic right pleural effusion.  There was cardiomegaly and vascular congestion without pulmonary edema.  The patient denies any symptoms of chest pain, palpitations, shortness of breath, lightheadedness, dizziness, leg swelling, orthopnea, PND, and syncope.  His weight is down 20 pounds since his last cardiology office visit in late August 2019.  He said he had been eating 3 meals a day and snacking on junk food in between but his wife has restricted his diet to 2 meals a day and no junk food.  He denies nausea, vomiting, and diarrhea.    Review of Systems: As per "subjective", otherwise negative.  No Known Allergies  Current Outpatient Medications  Medication Sig Dispense Refill  . allopurinol (ZYLOPRIM) 300 MG tablet Take 300 mg by mouth daily.      Marland Kitchen. amLODipine (NORVASC) 10 MG tablet Take 0.5 tablets (5 mg total) by mouth daily. 90 tablet 3  . aspirin (ASPIR-LOW) 81 MG EC tablet Take 81 mg by mouth daily.      . fish oil-omega-3 fatty acids 1000 MG capsule Take 2 g by mouth every morning. & 1 at supper    . furosemide (LASIX) 20 MG tablet Take 2 tablets (40 mg total) by mouth daily. 30 tablet 3  . levothyroxine (SYNTHROID, LEVOTHROID) 50 MCG tablet Take 50 mcg by mouth  daily.    . metoprolol tartrate (LOPRESSOR) 25 MG tablet TAKE ONE-HALF TABLET BY MOUTH TWICE DAILY 90 tablet 3  . potassium chloride SA (K-DUR,KLOR-CON) 20 MEQ tablet Take 1 tablet (20 mEq total) by mouth daily. 90 tablet 3  . simvastatin (ZOCOR) 10 MG tablet Take 1 tablet (10 mg total) by mouth daily. 30 tablet 6   No current facility-administered medications for this visit.     Past Medical History:  Diagnosis Date  . CAD (coronary artery disease)    s/p CABG  . Gout   . HLD (hyperlipidemia)    mixed  . HTN (hypertension)    unspecified    Past Surgical History:  Procedure Laterality Date  . CORONARY ARTERY BYPASS GRAFT      Social History   Socioeconomic History  . Marital status: Married    Spouse name: Not on file  . Number of children: Not on file  . Years of education: Not on file  . Highest education level: Not on file  Occupational History  . Not on file  Social Needs  . Financial resource strain: Not on file  . Food insecurity:    Worry: Not on file    Inability: Not on file  . Transportation needs:    Medical: Not on file    Non-medical: Not on file  Tobacco Use  . Smoking status: Former Smoker    Packs/day: 1.00    Years: 48.00    Pack years: 48.00    Types: Cigarettes    Start date:  11/17/1962    Last attempt to quit: 11/17/2001    Years since quitting: 17.0  . Smokeless tobacco: Never Used  Substance and Sexual Activity  . Alcohol use: Not on file  . Drug use: Not on file  . Sexual activity: Not on file  Lifestyle  . Physical activity:    Days per week: Not on file    Minutes per session: Not on file  . Stress: Not on file  Relationships  . Social connections:    Talks on phone: Not on file    Gets together: Not on file    Attends religious service: Not on file    Active member of club or organization: Not on file    Attends meetings of clubs or organizations: Not on file    Relationship status: Not on file  . Intimate partner violence:      Fear of current or ex partner: Not on file    Emotionally abused: Not on file    Physically abused: Not on file    Forced sexual activity: Not on file  Other Topics Concern  . Not on file  Social History Narrative  . Not on file     Vitals:   11/16/18 0954  BP: (!) 142/64  Pulse: 65  SpO2: 97%  Weight: 130 lb 12.8 oz (59.3 kg)  Height: 5\' 3"  (1.6 m)    Wt Readings from Last 3 Encounters:  11/16/18 130 lb 12.8 oz (59.3 kg)  07/15/18 150 lb (68 kg)  11/11/17 131 lb (59.4 kg)     PHYSICAL EXAM General: NAD HEENT: Normal. Neck: No JVD, no thyromegaly. Lungs: Diminished sounds throughout, no crackles or wheezes.  No dullness to percussion. CV: Regular rate and rhythm, normal S1/S2, no S3/S4, no murmur. No pretibial or periankle edema.  No carotid bruit.   Abdomen: Soft, nontender, no distention.  Neurologic: Alert and oriented.  Psych: Normal affect. Skin: Normal. Musculoskeletal: No gross deformities.    ECG: Reviewed above under Subjective   Labs: Lab Results  Component Value Date/Time   K 3.8 07/16/2018 10:31 AM   BUN 14 07/16/2018 10:31 AM   CREATININE 1.06 07/16/2018 10:31 AM   ALT 14 07/16/2018 10:31 AM   TSH 10.923 (H) 07/16/2018 10:32 AM   HGB 11.7 (L) 07/16/2018 10:31 AM     Lipids: No results found for: LDLCALC, LDLDIRECT, CHOL, TRIG, HDL     ASSESSMENT AND PLAN:  1. CAD s/p CABG: Stable ischemic heart disease. Stress test in 2013 showed no evidence of ischemia. Continue ASA, metoprolol, and simvastatin.   2. Essential HTN: Blood pressure is mildly elevated. No changes.  3. Hyperlipidemia:Will obtain copy of lipids from PCP. Continue simvastatin 10 mg daily.  4.  Chronic diastolic heart failure: Weight down 20 pounds since last cardiology office visit on 07/15/2018.  He attributes this to significantly decreasing food consumption.  Currently on Lasix 20 mg daily.  5.  Right-sided pleural effusion: Follows with pulmonary.  No clinical  evidence of recurrence today.   Disposition: Follow up 6 months   Prentice DockerSuresh Gabryela Kimbrell, M.D., F.A.C.C.

## 2018-12-06 DIAGNOSIS — H2513 Age-related nuclear cataract, bilateral: Secondary | ICD-10-CM | POA: Diagnosis not present

## 2018-12-07 ENCOUNTER — Other Ambulatory Visit: Payer: Self-pay | Admitting: Cardiovascular Disease

## 2018-12-20 DIAGNOSIS — H25812 Combined forms of age-related cataract, left eye: Secondary | ICD-10-CM | POA: Diagnosis not present

## 2018-12-20 DIAGNOSIS — Z01818 Encounter for other preprocedural examination: Secondary | ICD-10-CM | POA: Diagnosis not present

## 2019-01-07 DIAGNOSIS — H25812 Combined forms of age-related cataract, left eye: Secondary | ICD-10-CM | POA: Diagnosis not present

## 2019-01-07 DIAGNOSIS — H2512 Age-related nuclear cataract, left eye: Secondary | ICD-10-CM | POA: Diagnosis not present

## 2019-01-28 DIAGNOSIS — H2512 Age-related nuclear cataract, left eye: Secondary | ICD-10-CM | POA: Diagnosis not present

## 2019-01-28 DIAGNOSIS — H25812 Combined forms of age-related cataract, left eye: Secondary | ICD-10-CM | POA: Diagnosis not present

## 2019-03-30 DIAGNOSIS — H2511 Age-related nuclear cataract, right eye: Secondary | ICD-10-CM | POA: Diagnosis not present

## 2019-03-30 DIAGNOSIS — Z01818 Encounter for other preprocedural examination: Secondary | ICD-10-CM | POA: Diagnosis not present

## 2019-03-30 DIAGNOSIS — H25811 Combined forms of age-related cataract, right eye: Secondary | ICD-10-CM | POA: Diagnosis not present

## 2019-05-06 DIAGNOSIS — E785 Hyperlipidemia, unspecified: Secondary | ICD-10-CM | POA: Diagnosis not present

## 2019-05-06 DIAGNOSIS — I10 Essential (primary) hypertension: Secondary | ICD-10-CM | POA: Diagnosis not present

## 2019-05-06 DIAGNOSIS — E119 Type 2 diabetes mellitus without complications: Secondary | ICD-10-CM | POA: Diagnosis not present

## 2019-05-06 DIAGNOSIS — Z1389 Encounter for screening for other disorder: Secondary | ICD-10-CM | POA: Diagnosis not present

## 2019-05-06 DIAGNOSIS — Z129 Encounter for screening for malignant neoplasm, site unspecified: Secondary | ICD-10-CM | POA: Diagnosis not present

## 2019-05-06 DIAGNOSIS — L409 Psoriasis, unspecified: Secondary | ICD-10-CM | POA: Diagnosis not present

## 2019-05-06 DIAGNOSIS — Z6823 Body mass index (BMI) 23.0-23.9, adult: Secondary | ICD-10-CM | POA: Diagnosis not present

## 2019-05-06 DIAGNOSIS — I2510 Atherosclerotic heart disease of native coronary artery without angina pectoris: Secondary | ICD-10-CM | POA: Diagnosis not present

## 2019-05-25 ENCOUNTER — Ambulatory Visit: Payer: Medicare Other | Admitting: Cardiovascular Disease

## 2019-06-27 DIAGNOSIS — Z0000 Encounter for general adult medical examination without abnormal findings: Secondary | ICD-10-CM | POA: Diagnosis not present

## 2019-06-27 DIAGNOSIS — Z681 Body mass index (BMI) 19 or less, adult: Secondary | ICD-10-CM | POA: Diagnosis not present

## 2019-06-27 DIAGNOSIS — Z1389 Encounter for screening for other disorder: Secondary | ICD-10-CM | POA: Diagnosis not present

## 2019-08-15 ENCOUNTER — Encounter: Payer: Self-pay | Admitting: *Deleted

## 2019-08-15 ENCOUNTER — Encounter: Payer: Self-pay | Admitting: Cardiovascular Disease

## 2019-08-15 ENCOUNTER — Other Ambulatory Visit: Payer: Self-pay

## 2019-08-15 ENCOUNTER — Ambulatory Visit (INDEPENDENT_AMBULATORY_CARE_PROVIDER_SITE_OTHER): Payer: Medicare Other | Admitting: Cardiovascular Disease

## 2019-08-15 VITALS — BP 162/69 | HR 57 | Ht 63.0 in | Wt 140.6 lb

## 2019-08-15 DIAGNOSIS — I10 Essential (primary) hypertension: Secondary | ICD-10-CM

## 2019-08-15 DIAGNOSIS — I25708 Atherosclerosis of coronary artery bypass graft(s), unspecified, with other forms of angina pectoris: Secondary | ICD-10-CM | POA: Diagnosis not present

## 2019-08-15 DIAGNOSIS — I5032 Chronic diastolic (congestive) heart failure: Secondary | ICD-10-CM

## 2019-08-15 DIAGNOSIS — E785 Hyperlipidemia, unspecified: Secondary | ICD-10-CM | POA: Diagnosis not present

## 2019-08-15 DIAGNOSIS — J90 Pleural effusion, not elsewhere classified: Secondary | ICD-10-CM

## 2019-08-15 MED ORDER — CARVEDILOL 3.125 MG PO TABS: 3.1250 mg | ORAL_TABLET | Freq: Two times a day (BID) | ORAL | 6 refills | Status: DC

## 2019-08-15 NOTE — Progress Notes (Signed)
SUBJECTIVE: The patient presents for routine follow-up. He has a history of coronary artery disease with a history of CABG, chronic diastolic heart failure, hypertension, and hyperlipidemia.  He was hospitalized in August 2019 at [facility].  Echocardiogram at that time demonstrated normal left ventricular systolic function, LVEF 55 to 60%, moderate right ventricular enlargement with moderately decreased contraction, moderately dilated right atrium, and moderate tricuspid regurgitation.  He has a history of right sided pleural effusion and had been following with pulmonary.  He had a transudate of pleural effusion when hospitalized in August and underwent thoracentesis.  The patient denies any symptoms of chest pain, palpitations, shortness of breath, lightheadedness, dizziness, leg swelling, orthopnea, PND, and syncope.  ECG performed in the office today which I ordered and personally interpreted demonstrates normal sinus rhythm with possible old inferior infarct and T wave inversions in V1 through V4 and otherwise nonspecific T wave abnormalities.   Review of Systems: As per "subjective", otherwise negative.  No Known Allergies  Current Outpatient Medications  Medication Sig Dispense Refill   allopurinol (ZYLOPRIM) 300 MG tablet Take 300 mg by mouth daily.       amLODipine (NORVASC) 10 MG tablet TAKE 1 TABLET BY MOUTH ONCE DAILY 90 tablet 2   aspirin (ASPIR-LOW) 81 MG EC tablet Take 81 mg by mouth daily.       fish oil-omega-3 fatty acids 1000 MG capsule Take 2 g by mouth every morning. & 1 at supper     furosemide (LASIX) 20 MG tablet Take 2 tablets (40 mg total) by mouth daily. 30 tablet 3   levothyroxine (SYNTHROID, LEVOTHROID) 50 MCG tablet Take 50 mcg by mouth daily.     metoprolol tartrate (LOPRESSOR) 25 MG tablet TAKE ONE-HALF TABLET BY MOUTH TWICE DAILY 90 tablet 3   potassium chloride SA (K-DUR,KLOR-CON) 20 MEQ tablet Take 1 tablet (20 mEq total) by mouth  daily. 90 tablet 3   simvastatin (ZOCOR) 10 MG tablet Take 1 tablet (10 mg total) by mouth daily. 30 tablet 6   No current facility-administered medications for this visit.     Past Medical History:  Diagnosis Date   CAD (coronary artery disease)    s/p CABG   Gout    HLD (hyperlipidemia)    mixed   HTN (hypertension)    unspecified    Past Surgical History:  Procedure Laterality Date   CORONARY ARTERY BYPASS GRAFT      Social History   Socioeconomic History   Marital status: Married    Spouse name: Not on file   Number of children: Not on file   Years of education: Not on file   Highest education level: Not on file  Occupational History   Not on file  Social Needs   Financial resource strain: Not on file   Food insecurity    Worry: Not on file    Inability: Not on file   Transportation needs    Medical: Not on file    Non-medical: Not on file  Tobacco Use   Smoking status: Former Smoker    Packs/day: 1.00    Years: 48.00    Pack years: 48.00    Types: Cigarettes    Start date: 11/17/1962    Quit date: 11/17/2001    Years since quitting: 17.7   Smokeless tobacco: Never Used  Substance and Sexual Activity   Alcohol use: Not on file   Drug use: Not on file   Sexual activity: Not on  file  Lifestyle   Physical activity    Days per week: Not on file    Minutes per session: Not on file   Stress: Not on file  Relationships   Social connections    Talks on phone: Not on file    Gets together: Not on file    Attends religious service: Not on file    Active member of club or organization: Not on file    Attends meetings of clubs or organizations: Not on file    Relationship status: Not on file   Intimate partner violence    Fear of current or ex partner: Not on file    Emotionally abused: Not on file    Physically abused: Not on file    Forced sexual activity: Not on file  Other Topics Concern   Not on file  Social History Narrative     Not on file     Vitals:   08/15/19 1043  BP: (!) 162/69  Pulse: (!) 57  SpO2: 99%  Weight: 140 lb 9.6 oz (63.8 kg)  Height: 5\' 3"  (1.6 m)    Wt Readings from Last 3 Encounters:  08/15/19 140 lb 9.6 oz (63.8 kg)  11/16/18 130 lb 12.8 oz (59.3 kg)  07/15/18 150 lb (68 kg)     PHYSICAL EXAM General: NAD HEENT: Normal. Neck: No JVD, no thyromegaly. Lungs: Diminished sounds throughout, no crackles or wheezes.  CV: Regular rate and rhythm, normal S1/S2, no S3/S4, 1/6 systolic murmur along left sternal border. No pretibial or periankle edema.  No carotid bruit.   Abdomen: Soft, nontender, no distention.  Neurologic: Alert and oriented.  Psych: Normal affect. Skin: Normal. Musculoskeletal: No gross deformities.      Labs: Lab Results  Component Value Date/Time   K 3.8 07/16/2018 10:31 AM   BUN 14 07/16/2018 10:31 AM   CREATININE 1.06 07/16/2018 10:31 AM   ALT 14 07/16/2018 10:31 AM   TSH 10.923 (H) 07/16/2018 10:32 AM   HGB 11.7 (L) 07/16/2018 10:31 AM     Lipids: No results found for: LDLCALC, LDLDIRECT, CHOL, TRIG, HDL     ASSESSMENT AND PLAN:  1. CAD s/p CABG: Stable ischemic heart disease. Stress test in 2013 showed no evidence of ischemia. Continue ASA and simvastatin.  I will switch metoprolol to carvedilol for improved blood pressure control.  2. Essential HTN: Blood pressure is markedly elevated.  I will switch metoprolol to carvedilol for improved blood pressure control.  3. Hyperlipidemia:Will obtain copy of lipids from PCP. Continue simvastatin 10 mg daily.  4.  Chronic diastolic heart failure: Weight up 10 pounds pounds since last cardiology office visit in December 2019. Currently on Lasix 20 mg daily.  Symptomatically stable.  No changes to therapy.  5.  Right-sided pleural effusion: He used to follow with pulmonary.  No clinical evidence of recurrence today.   Disposition: Follow up 6 months   January 2020, M.D., F.A.C.C.

## 2019-08-15 NOTE — Patient Instructions (Signed)
Medication Instructions:   Stop Metoprolol (Lopressor).  Begin Coreg 3.125mg  twice a day.  Continue all other medications.    Labwork: none  Testing/Procedures: none  Follow-Up: Your physician wants you to follow up in: 6 months.  You will receive a reminder letter in the mail one-two months in advance.  If you don't receive a letter, please call our office to schedule the follow up appointment   Any Other Special Instructions Will Be Listed Below (If Applicable).  If you need a refill on your cardiac medications before your next appointment, please call your pharmacy.

## 2019-08-15 NOTE — Addendum Note (Signed)
Addended by: Merlene Laughter on: 08/15/2019 01:58 PM   Modules accepted: Orders

## 2019-08-31 ENCOUNTER — Other Ambulatory Visit: Payer: Self-pay | Admitting: Cardiovascular Disease

## 2019-09-26 DIAGNOSIS — H353121 Nonexudative age-related macular degeneration, left eye, early dry stage: Secondary | ICD-10-CM | POA: Diagnosis not present

## 2019-09-26 DIAGNOSIS — Z961 Presence of intraocular lens: Secondary | ICD-10-CM | POA: Diagnosis not present

## 2019-09-26 DIAGNOSIS — H43811 Vitreous degeneration, right eye: Secondary | ICD-10-CM | POA: Diagnosis not present

## 2019-09-26 DIAGNOSIS — H40002 Preglaucoma, unspecified, left eye: Secondary | ICD-10-CM | POA: Diagnosis not present

## 2019-09-26 DIAGNOSIS — H11823 Conjunctivochalasis, bilateral: Secondary | ICD-10-CM | POA: Diagnosis not present

## 2019-09-26 DIAGNOSIS — H40013 Open angle with borderline findings, low risk, bilateral: Secondary | ICD-10-CM | POA: Diagnosis not present

## 2019-09-26 DIAGNOSIS — H40001 Preglaucoma, unspecified, right eye: Secondary | ICD-10-CM | POA: Diagnosis not present

## 2019-09-26 DIAGNOSIS — H11153 Pinguecula, bilateral: Secondary | ICD-10-CM | POA: Diagnosis not present

## 2020-01-23 IMAGING — DX DG CHEST 2V
2 series · 2 of 2 positions shown · non-contrast
Comparison: 07/01/2018

CLINICAL DATA: Diastolic CHF

EXAM:
CHEST - 2 VIEW

[chest pa]
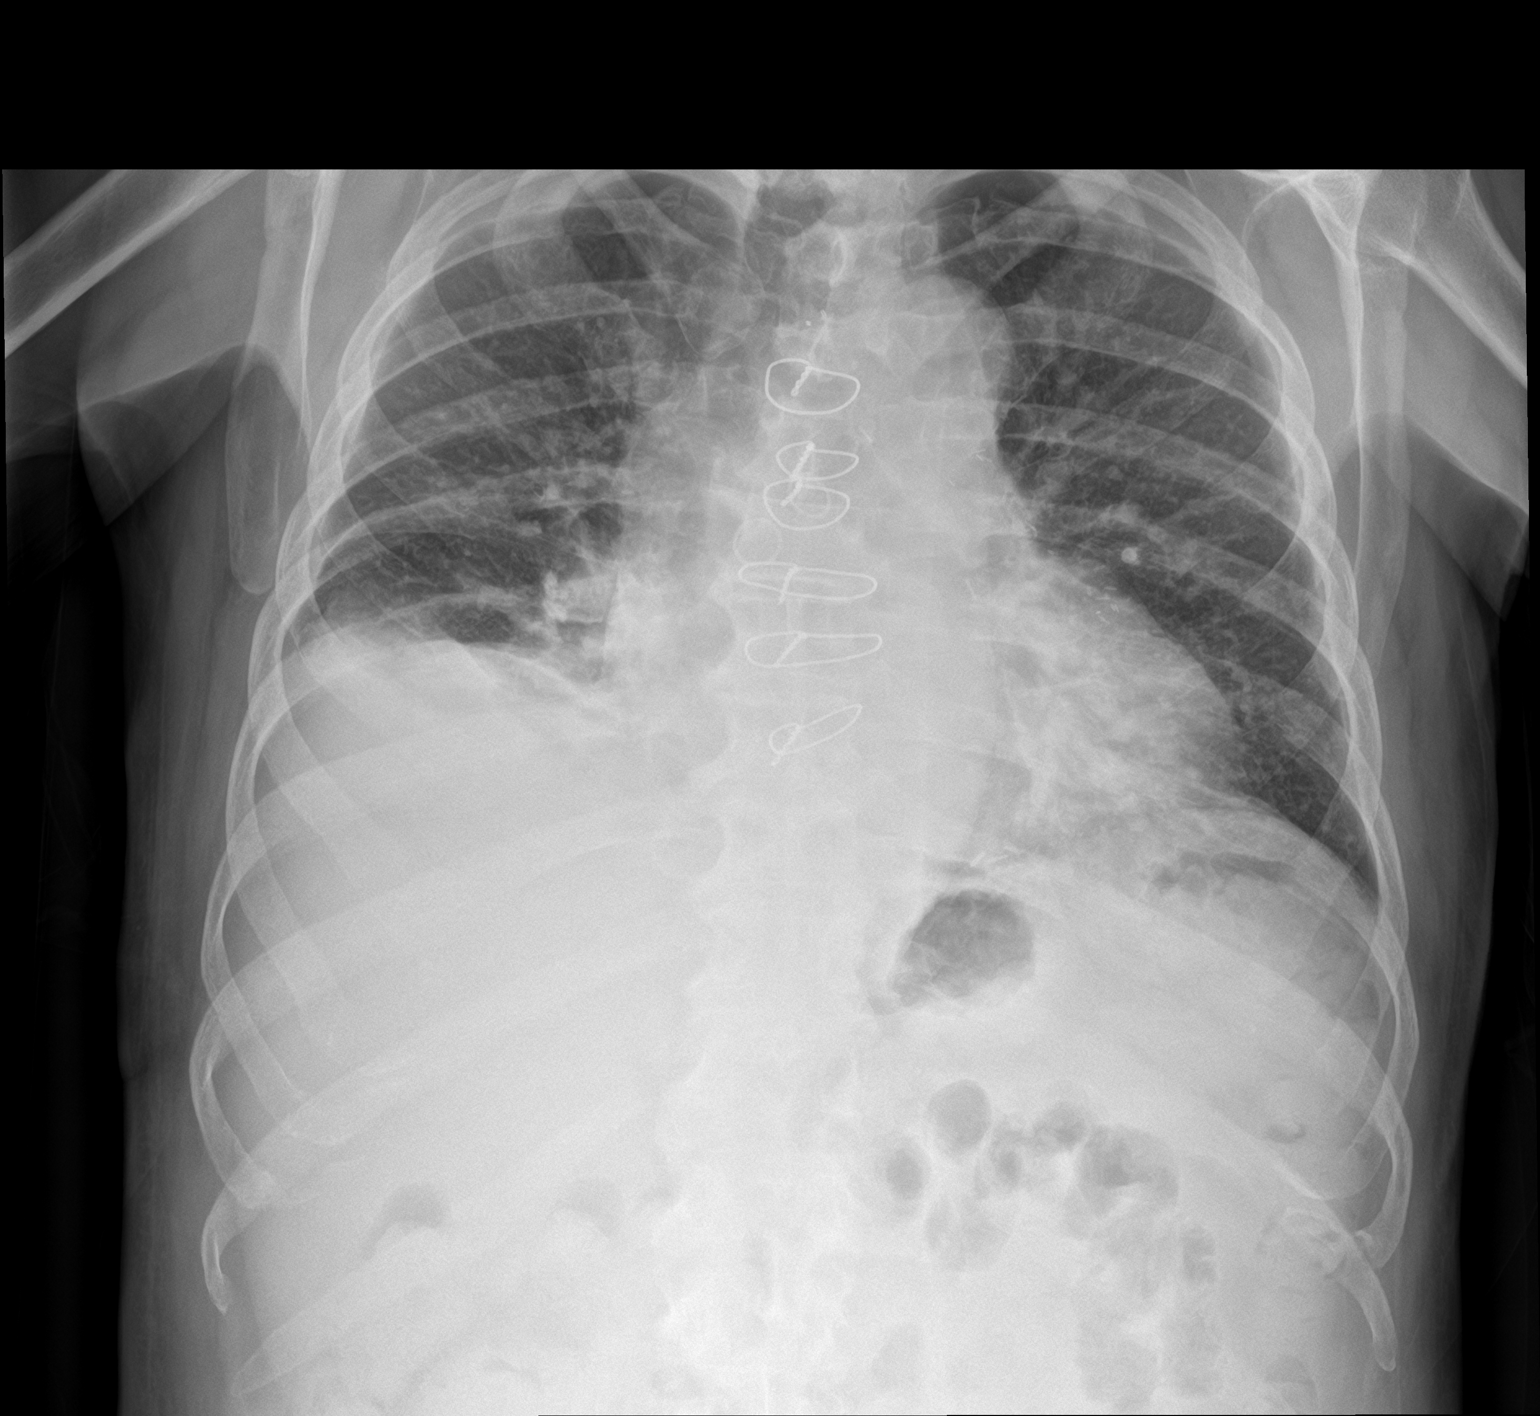

[chest lat]
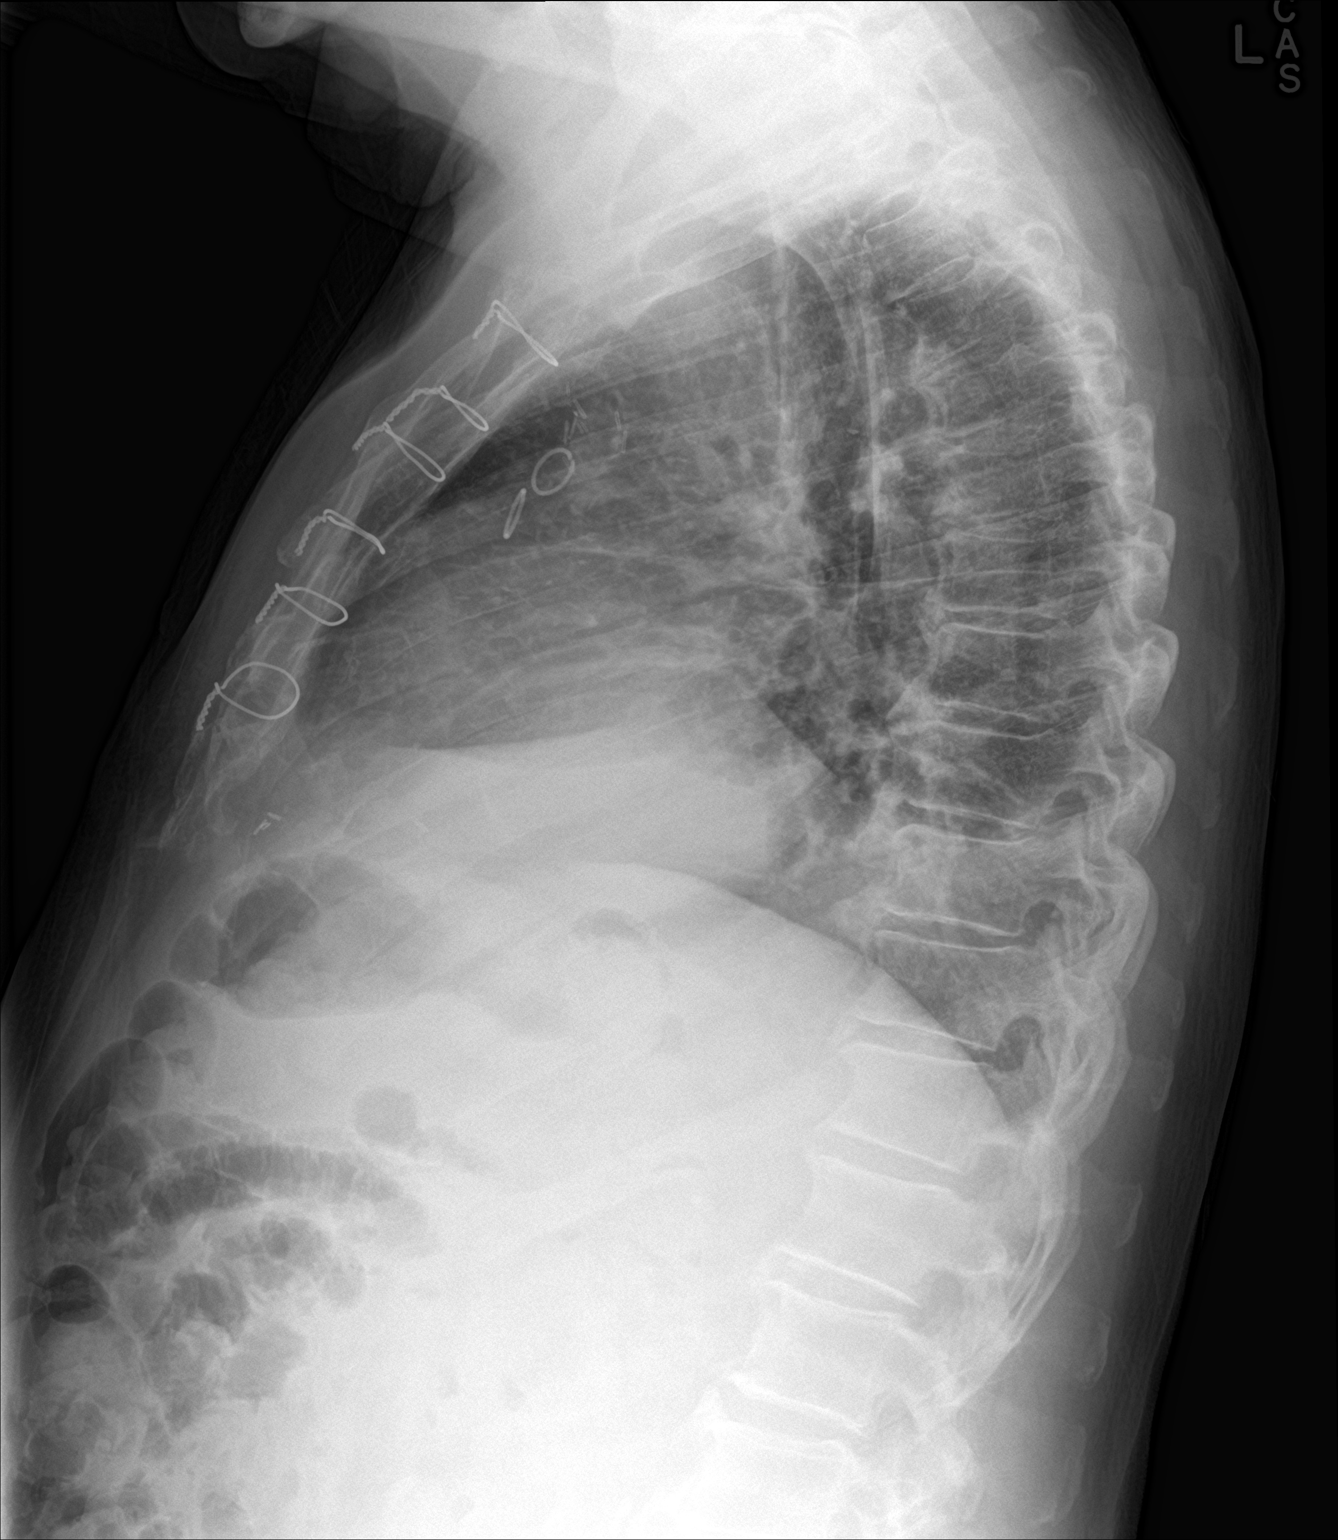

[2 of 2 positions shown; findings below may reference images not displayed]

FINDINGS: Subpulmonic right pleural effusion is suspected. Lungs are under
aerated. Upper normal heart size. Vascular congestion. No sign of
interstitial edema. No pneumothorax. Postoperative changes from
CABG.
IMPRESSION: Increasing subpulmonic right pleural effusion is suspected.

Cardiomegaly and vascular congestion without pulmonary edema.

## 2020-02-28 ENCOUNTER — Other Ambulatory Visit: Payer: Self-pay | Admitting: Cardiovascular Disease

## 2020-03-26 DIAGNOSIS — H40012 Open angle with borderline findings, low risk, left eye: Secondary | ICD-10-CM | POA: Diagnosis not present

## 2020-04-02 ENCOUNTER — Ambulatory Visit (INDEPENDENT_AMBULATORY_CARE_PROVIDER_SITE_OTHER): Payer: Medicare Other | Admitting: Cardiovascular Disease

## 2020-04-02 ENCOUNTER — Other Ambulatory Visit: Payer: Self-pay

## 2020-04-02 ENCOUNTER — Encounter: Payer: Self-pay | Admitting: Cardiovascular Disease

## 2020-04-02 VITALS — BP 124/60 | HR 47 | Ht 63.0 in | Wt 142.0 lb

## 2020-04-02 DIAGNOSIS — E785 Hyperlipidemia, unspecified: Secondary | ICD-10-CM | POA: Diagnosis not present

## 2020-04-02 DIAGNOSIS — I5032 Chronic diastolic (congestive) heart failure: Secondary | ICD-10-CM | POA: Diagnosis not present

## 2020-04-02 DIAGNOSIS — I25708 Atherosclerosis of coronary artery bypass graft(s), unspecified, with other forms of angina pectoris: Secondary | ICD-10-CM

## 2020-04-02 DIAGNOSIS — I10 Essential (primary) hypertension: Secondary | ICD-10-CM

## 2020-04-02 DIAGNOSIS — J90 Pleural effusion, not elsewhere classified: Secondary | ICD-10-CM

## 2020-04-02 NOTE — Progress Notes (Signed)
SUBJECTIVE: The patient presents for routine follow-up. He has a history of coronary artery disease with a history of CABG, chronic diastolic heart failure, hypertension, and hyperlipidemia.  He was hospitalized in August 2019 at [facility].  Echocardiogram at that time demonstrated normal left ventricular systolic function, LVEF 55 to 60%, moderate right ventricular enlargement with moderately decreased contraction, moderately dilated right atrium, and moderate tricuspid regurgitation.  He has a history of right sided pleural effusion and had been following with pulmonary.  He had a transudate of pleural effusion when hospitalized in August and underwent thoracentesis.  The patient denies any symptoms of chest pain, palpitations, shortness of breath, lightheadedness, dizziness, leg swelling, orthopnea, PND, and syncope.  He took both tablets of carvedilol together this morning.  Review of Systems: As per "subjective", otherwise negative.  No Known Allergies  Current Outpatient Medications  Medication Sig Dispense Refill  . allopurinol (ZYLOPRIM) 300 MG tablet Take 300 mg by mouth daily.      Marland Kitchen amLODipine (NORVASC) 10 MG tablet Take 1 tablet by mouth once daily 90 tablet 1  . aspirin (ASPIR-LOW) 81 MG EC tablet Take 81 mg by mouth daily.      . carvedilol (COREG) 3.125 MG tablet Take 1 tablet (3.125 mg total) by mouth 2 (two) times daily. 60 tablet 6  . fish oil-omega-3 fatty acids 1000 MG capsule Take 2 g by mouth every morning. & 1 at supper    . furosemide (LASIX) 20 MG tablet Take 2 tablets (40 mg total) by mouth daily. 30 tablet 3  . levothyroxine (SYNTHROID, LEVOTHROID) 50 MCG tablet Take 50 mcg by mouth daily.    . potassium chloride SA (K-DUR,KLOR-CON) 20 MEQ tablet Take 1 tablet (20 mEq total) by mouth daily. 90 tablet 3  . simvastatin (ZOCOR) 10 MG tablet Take 1 tablet (10 mg total) by mouth daily. 30 tablet 6   No current facility-administered medications for  this visit.    Past Medical History:  Diagnosis Date  . CAD (coronary artery disease)    s/p CABG  . Gout   . HLD (hyperlipidemia)    mixed  . HTN (hypertension)    unspecified    Past Surgical History:  Procedure Laterality Date  . CORONARY ARTERY BYPASS GRAFT      Social History   Socioeconomic History  . Marital status: Married    Spouse name: Not on file  . Number of children: Not on file  . Years of education: Not on file  . Highest education level: Not on file  Occupational History  . Not on file  Tobacco Use  . Smoking status: Former Smoker    Packs/day: 1.00    Years: 48.00    Pack years: 48.00    Types: Cigarettes    Start date: 11/17/1962    Quit date: 11/17/2001    Years since quitting: 18.3  . Smokeless tobacco: Never Used  Substance and Sexual Activity  . Alcohol use: Not on file  . Drug use: Not on file  . Sexual activity: Not on file  Other Topics Concern  . Not on file  Social History Narrative  . Not on file   Social Determinants of Health   Financial Resource Strain:   . Difficulty of Paying Living Expenses:   Food Insecurity:   . Worried About Charity fundraiser in the Last Year:   . Arboriculturist in the Last Year:   Transportation Needs:   .  Lack of Transportation (Medical):   Marland Kitchen Lack of Transportation (Non-Medical):   Physical Activity:   . Days of Exercise per Week:   . Minutes of Exercise per Session:   Stress:   . Feeling of Stress :   Social Connections:   . Frequency of Communication with Friends and Family:   . Frequency of Social Gatherings with Friends and Family:   . Attends Religious Services:   . Active Member of Clubs or Organizations:   . Attends Banker Meetings:   Marland Kitchen Marital Status:   Intimate Partner Violence:   . Fear of Current or Ex-Partner:   . Emotionally Abused:   Marland Kitchen Physically Abused:   . Sexually Abused:      Vitals:   04/02/20 1443  BP: 124/60  Pulse: (!) 47  SpO2: 96%  Weight:  142 lb (64.4 kg)  Height: 5\' 3"  (1.6 m)    Wt Readings from Last 3 Encounters:  04/02/20 142 lb (64.4 kg)  08/15/19 140 lb 9.6 oz (63.8 kg)  11/16/18 130 lb 12.8 oz (59.3 kg)     PHYSICAL EXAM General: NAD HEENT: Normal. Neck: No JVD, no thyromegaly. Lungs: Diminished sounds throughout, no crackles or wheezes.  CV: Bradycardic, normal S1/S2, no S3/S4, 1/6 systolic murmur along left sternal border. No pretibial or periankle edema.  No carotid bruit.   Abdomen: Soft, nontender, no distention.  Neurologic: Alert and oriented.  Psych: Normal affect. Skin: Normal. Musculoskeletal: No gross deformities.      Labs: Lab Results  Component Value Date/Time   K 3.8 07/16/2018 10:31 AM   BUN 14 07/16/2018 10:31 AM   CREATININE 1.06 07/16/2018 10:31 AM   ALT 14 07/16/2018 10:31 AM   TSH 10.923 (H) 07/16/2018 10:32 AM   HGB 11.7 (L) 07/16/2018 10:31 AM     Lipids: No results found for: LDLCALC, LDLDIRECT, CHOL, TRIG, HDL     ASSESSMENT AND PLAN:  1. CAD s/p CABG: Stable ischemic heart disease. Stress test in 2013 showed no evidence of ischemia. Continue ASA, carvedilol 3.125 mg twice daily, and simvastatin.  I instructed him not to take both tablets of carvedilol together as he did this morning which has led to a heart rate in the 40 bpm range.  He is asymptomatic with respect to this.  2. Essential HTN: Blood pressure is normal.  No changes.  3. Hyperlipidemia:LDL 51 in June 2020. Continue simvastatin 10 mg daily.  4.  Chronic diastolic heart failure: No signs of hypervolemia.  Currently on Lasix 20 mg daily.  Symptomatically stable.  No changes to therapy.  5.  Right-sided pleural effusion: He used to follow with pulmonary.  No clinical evidence of recurrence today.   Disposition: Follow up 6 months   July 2020, M.D., F.A.C.C.

## 2020-04-02 NOTE — Patient Instructions (Signed)
Medication Instructions:  Continue all current medications.   Labwork: none  Testing/Procedures: none  Follow-Up: 6 months   Any Other Special Instructions Will Be Listed Below (If Applicable).   If you need a refill on your cardiac medications before your next appointment, please call your pharmacy.  

## 2020-04-30 ENCOUNTER — Other Ambulatory Visit: Payer: Self-pay | Admitting: Cardiovascular Disease

## 2020-04-30 MED ORDER — POTASSIUM CHLORIDE CRYS ER 20 MEQ PO TBCR: 20.0000 meq | EXTENDED_RELEASE_TABLET | Freq: Every day | ORAL | 3 refills | Status: DC

## 2020-04-30 NOTE — Telephone Encounter (Signed)
Potassium REFILL

## 2020-04-30 NOTE — Telephone Encounter (Signed)
Refilled potassium

## 2020-04-30 NOTE — Telephone Encounter (Signed)
Pt. Calling for his potassium refills. He says that the pharmacy does not have an order for him to get them

## 2020-04-30 NOTE — Telephone Encounter (Signed)
refilled potassium  

## 2020-05-30 DIAGNOSIS — I10 Essential (primary) hypertension: Secondary | ICD-10-CM | POA: Diagnosis not present

## 2020-05-30 DIAGNOSIS — Z1389 Encounter for screening for other disorder: Secondary | ICD-10-CM | POA: Diagnosis not present

## 2020-05-30 DIAGNOSIS — E119 Type 2 diabetes mellitus without complications: Secondary | ICD-10-CM | POA: Diagnosis not present

## 2020-05-30 DIAGNOSIS — Z6826 Body mass index (BMI) 26.0-26.9, adult: Secondary | ICD-10-CM | POA: Diagnosis not present

## 2020-05-30 DIAGNOSIS — E663 Overweight: Secondary | ICD-10-CM | POA: Diagnosis not present

## 2020-06-13 ENCOUNTER — Other Ambulatory Visit: Payer: Self-pay | Admitting: Cardiology

## 2020-07-01 DIAGNOSIS — W19XXXA Unspecified fall, initial encounter: Secondary | ICD-10-CM | POA: Diagnosis not present

## 2020-07-01 DIAGNOSIS — R5381 Other malaise: Secondary | ICD-10-CM | POA: Diagnosis not present

## 2020-07-01 DIAGNOSIS — T1490XA Injury, unspecified, initial encounter: Secondary | ICD-10-CM | POA: Diagnosis not present

## 2020-07-03 DIAGNOSIS — E663 Overweight: Secondary | ICD-10-CM | POA: Diagnosis not present

## 2020-07-03 DIAGNOSIS — R600 Localized edema: Secondary | ICD-10-CM | POA: Diagnosis not present

## 2020-07-03 DIAGNOSIS — Z6825 Body mass index (BMI) 25.0-25.9, adult: Secondary | ICD-10-CM | POA: Diagnosis not present

## 2020-07-26 ENCOUNTER — Other Ambulatory Visit: Payer: Self-pay | Admitting: *Deleted

## 2020-07-26 MED ORDER — AMLODIPINE BESYLATE 10 MG PO TABS: 10.0000 mg | ORAL_TABLET | Freq: Every day | ORAL | 1 refills | Status: DC

## 2020-08-04 DIAGNOSIS — M109 Gout, unspecified: Secondary | ICD-10-CM | POA: Diagnosis not present

## 2020-08-07 DIAGNOSIS — M1711 Unilateral primary osteoarthritis, right knee: Secondary | ICD-10-CM | POA: Diagnosis not present

## 2020-08-07 DIAGNOSIS — M7041 Prepatellar bursitis, right knee: Secondary | ICD-10-CM | POA: Diagnosis not present

## 2020-08-07 DIAGNOSIS — Z681 Body mass index (BMI) 19 or less, adult: Secondary | ICD-10-CM | POA: Diagnosis not present

## 2020-08-29 DIAGNOSIS — Z23 Encounter for immunization: Secondary | ICD-10-CM | POA: Diagnosis not present

## 2020-09-09 ENCOUNTER — Other Ambulatory Visit: Payer: Self-pay | Admitting: Cardiology

## 2020-09-12 DIAGNOSIS — Z6823 Body mass index (BMI) 23.0-23.9, adult: Secondary | ICD-10-CM | POA: Diagnosis not present

## 2020-09-12 DIAGNOSIS — Z0000 Encounter for general adult medical examination without abnormal findings: Secondary | ICD-10-CM | POA: Diagnosis not present

## 2020-09-12 DIAGNOSIS — E7849 Other hyperlipidemia: Secondary | ICD-10-CM | POA: Diagnosis not present

## 2020-09-12 DIAGNOSIS — Z0001 Encounter for general adult medical examination with abnormal findings: Secondary | ICD-10-CM | POA: Diagnosis not present

## 2020-09-12 DIAGNOSIS — E119 Type 2 diabetes mellitus without complications: Secondary | ICD-10-CM | POA: Diagnosis not present

## 2020-09-12 DIAGNOSIS — E039 Hypothyroidism, unspecified: Secondary | ICD-10-CM | POA: Diagnosis not present

## 2020-10-17 ENCOUNTER — Ambulatory Visit: Payer: Medicare Other | Admitting: Cardiovascular Disease

## 2020-10-23 ENCOUNTER — Encounter: Payer: Self-pay | Admitting: Cardiology

## 2020-10-23 NOTE — Progress Notes (Signed)
Cardiology Office Note  Date: 10/24/2020   ID: [name], DOB Jun 10, 1935, MRN [id number]  PCP:  Assunta Found, MD  Cardiologist:  Nona Dell, MD Electrophysiologist:  None   Chief Complaint  Patient presents with  . Cardiac follow-up    History of Present Illness: [name] is an 85 y.o. male former patient of Dr. Purvis Sheffield now presenting to establish follow-up with me.  I reviewed his records and updated the chart.  He was last seen in May.  He presents today reporting no obvious angina symptoms with typical ADLs.  He lives in an apartment with his wife here in [place].  He does not describe any progressive shortness of breath, no palpitations or syncope.  LDL was 51 by lab work in June of last year.  We are requesting interval lab work from Dr. Phillips Odor.  I reviewed his medications which are outlined below, he reports compliance.  I personally reviewed his ECG today which shows sinus bradycardia with old inferior infarct pattern, low voltage and decreased R wave progression.  Past Medical History:  Diagnosis Date  . CAD (coronary artery disease)    Status post CABG 2003 (LIMA to LAD, SVG to circumflex, SVG to RCA)  . Essential hypertension   . Gout   . Hyperlipidemia     Past Surgical History:  Procedure Laterality Date  . CORONARY ARTERY BYPASS GRAFT      Current Outpatient Medications  Medication Sig Dispense Refill  . allopurinol (ZYLOPRIM) 300 MG tablet Take 300 mg by mouth daily.      Marland Kitchen amLODipine (NORVASC) 10 MG tablet Take 1 tablet (10 mg total) by mouth daily. 90 tablet 1  . aspirin (ASPIR-LOW) 81 MG EC tablet Take 81 mg by mouth daily.      . carvedilol (COREG) 3.125 MG tablet TAKE 1 TABLET BY MOUTH TWICE DAILY 60 tablet 1  . fish oil-omega-3 fatty acids 1000 MG capsule Take 2 g by mouth every morning. & 1 at supper    . furosemide (LASIX) 20 MG tablet Take 2 tablets (40 mg total) by mouth daily. 30 tablet 3  . levothyroxine (SYNTHROID,  LEVOTHROID) 50 MCG tablet Take 50 mcg by mouth daily.    . potassium chloride SA (KLOR-CON) 20 MEQ tablet Take 1 tablet (20 mEq total) by mouth daily. 90 tablet 3  . simvastatin (ZOCOR) 10 MG tablet Take 1 tablet (10 mg total) by mouth daily. 30 tablet 6   No current facility-administered medications for this visit.   Allergies:  Patient has no known allergies.   ROS: Arthritic pains.  Physical Exam: VS:  BP (!) 142/60   Pulse 76   Ht 5\' 3"  (1.6 m)   Wt 145 lb (65.8 kg)   SpO2 96%   BMI 25.69 kg/m , BMI Body mass index is 25.69 kg/m.  Wt Readings from Last 3 Encounters:  10/24/20 145 lb (65.8 kg)  04/02/20 142 lb (64.4 kg)  08/15/19 140 lb 9.6 oz (63.8 kg)    General: Elderly male, appears comfortable at rest. HEENT: Conjunctiva and lids normal, wearing a mask. Neck: Supple, no elevated JVP or carotid bruits, no thyromegaly. Lungs: Clear to auscultation, nonlabored breathing at rest. Cardiac: Regular rate and rhythm, no S3, 2-3/6 systolic murmur, no pericardial rub. Abdomen: Soft, nontender, bowel sounds present. Extremities: No pitting edema.  ECG:  An ECG dated 08/15/2019 was personally reviewed today and demonstrated:  Sinus rhythm with borderline inferior Q waves, nonspecific ST-T wave abnormalities.  Recent Labwork:  June 2020: Hemoglobin 12.7, platelets 209, BNP 22, creatinine 1.1, potassium 4.8, AST 17, ALT 13, cholesterol 134, triglycerides 118, HDL 59, LDL 51, TSH 4.05, hemoglobin A1c 5.7%  Other Studies Reviewed Today:  Lexiscan Myoview 08/10/2012: IMPRESSION:  Abnormal pharmacologic stress nuclear myocardial study revealing no  significant stress-induced EKG abnormalities, normal left  ventricular size and overall normal left ventricular systolic  function with a small segmental wall motion abnormality as  described. By scintigraphic imaging, there is a small basilar  inferior myocardial infarction. No evidence for ischemia. Other  findings as noted.    Echocardiogram 07/01/2018 [facility]): LVEF 55 to 60%, moderately dilated right ventricle with moderately reduced contraction, moderate right atrial enlargement, mildly thickened aortic leaflets with mildly limited cusp excursion, moderate tricuspid regurgitation.  Assessment and Plan:  1.  Multivessel CAD status post CABG in 2003 as outlined above.  He does not report any active angina on medical therapy and we will continue with observation at this point.  ECG reviewed.  Continue aspirin, Norvasc, Coreg, and Zocor.  2.  Cardiac murmur, echocardiogram from 2019 revealed thickened aortic leaflets with limited cusp excursion.  Follow-up study will be obtained for reassessment.  3.  Mixed hyperlipidemia, he continues on Zocor.  LDL was 51 in June 2020.  Requesting interval lab work from Dr. Phillips Odor.  4.  Previous history of right-sided pleural effusion and hypervolemia.  He has moderately reduced RV contraction by last assessment in 2019, normal LVEF.  Continues on Lasix with potassium supplement.  Weight is stable.  Medication Adjustments/Labs and Tests Ordered: Current medicines are reviewed at length with the patient today.  Concerns regarding medicines are outlined above.   Tests Ordered: Orders Placed This Encounter  Procedures  . ECHOCARDIOGRAM COMPLETE    Medication Changes: No orders of the defined types were placed in this encounter.   Disposition:  Follow up 6 months in the [place] office.  Signed, Jonelle Sidle, MD, [facility] 10/24/2020 8:47 AM    [facility] at [facility] [address] [place], [place], [place] [address] Phone: [phone]; Fax: [phone]

## 2020-10-24 ENCOUNTER — Encounter: Payer: Self-pay | Admitting: Cardiology

## 2020-10-24 ENCOUNTER — Encounter: Payer: Self-pay | Admitting: *Deleted

## 2020-10-24 ENCOUNTER — Ambulatory Visit (INDEPENDENT_AMBULATORY_CARE_PROVIDER_SITE_OTHER): Payer: Medicare Other | Admitting: Cardiology

## 2020-10-24 VITALS — BP 142/60 | HR 76 | Ht 63.0 in | Wt 145.0 lb

## 2020-10-24 DIAGNOSIS — R011 Cardiac murmur, unspecified: Secondary | ICD-10-CM

## 2020-10-24 DIAGNOSIS — I25119 Atherosclerotic heart disease of native coronary artery with unspecified angina pectoris: Secondary | ICD-10-CM | POA: Diagnosis not present

## 2020-10-24 DIAGNOSIS — E782 Mixed hyperlipidemia: Secondary | ICD-10-CM | POA: Diagnosis not present

## 2020-10-24 NOTE — Patient Instructions (Addendum)
Your physician wants you to follow-up in: 6 MONTHS WITH DR MCDOWELL   Your physician recommends that you continue on your current medications as directed. Please refer to the Current Medication list given to you today.  Your physician has requested that you have an echocardiogram. Echocardiography is a painless test that uses sound waves to create images of your heart. It provides your doctor with information about the size and shape of your heart and how well your heart's chambers and valves are working. This procedure takes approximately one hour. There are no restrictions for this procedure.  Thank you for choosing [facility]!!    

## 2020-10-24 NOTE — Addendum Note (Signed)
Addended by: Burman Nieves T on: 10/24/2020 09:38 AM   Modules accepted: Orders

## 2020-11-06 ENCOUNTER — Other Ambulatory Visit: Payer: Self-pay | Admitting: Family Medicine

## 2020-11-07 ENCOUNTER — Other Ambulatory Visit: Payer: Medicare Other

## 2020-12-12 ENCOUNTER — Other Ambulatory Visit: Payer: Self-pay

## 2020-12-12 ENCOUNTER — Ambulatory Visit (INDEPENDENT_AMBULATORY_CARE_PROVIDER_SITE_OTHER): Payer: Medicare Other

## 2020-12-12 DIAGNOSIS — R011 Cardiac murmur, unspecified: Secondary | ICD-10-CM | POA: Diagnosis not present

## 2020-12-12 LAB — ECHOCARDIOGRAM COMPLETE
Area-P 1/2: 3.99 cm2
Calc EF: 70.3 %
MV M vel: 2.21 m/s
MV Peak grad: 19.6 mmHg
S' Lateral: 3.26 cm
Single Plane A2C EF: 73.5 %
Single Plane A4C EF: 65.7 %

## 2020-12-14 ENCOUNTER — Telehealth: Payer: Self-pay | Admitting: *Deleted

## 2020-12-14 NOTE — Telephone Encounter (Signed)
Patient informed. Copy sent to PCP °

## 2020-12-14 NOTE — Telephone Encounter (Signed)
-----   Message from Jonelle Sidle, MD sent at 12/12/2020  4:42 PM EST ----- Results reviewed.  LVEF remains normal.  Right ventricular dysfunction is stable in comparison to the prior study.  Aortic valve is only mildly calcified, not stenotic.  Would continue with current medications and follow-up plan.

## 2021-01-07 ENCOUNTER — Other Ambulatory Visit: Payer: Self-pay | Admitting: Family Medicine

## 2021-04-24 ENCOUNTER — Ambulatory Visit: Payer: Medicare Other | Admitting: Cardiology

## 2021-04-24 NOTE — Progress Notes (Deleted)
Cardiology Office Note  Date: 04/24/2021   ID: [name], DOB [birth year], MRN [id number]  PCP:  Assunta Found, MD  Cardiologist:  Nona Dell, MD Electrophysiologist:  None   No chief complaint on file.   History of Present Illness: [name] is an 86 y.o. male last seen in December 2021.  Follow-up echocardiogram in January revealed LVEF 55 to 60% with moderate diastolic dysfunction, moderately reduced RV contraction with RVSP 36 mmHg, and mildly calcified aortic valve without stenosis.  Past Medical History:  Diagnosis Date  . CAD (coronary artery disease)    Status post CABG 2003 (LIMA to LAD, SVG to circumflex, SVG to RCA)  . Essential hypertension   . Gout   . Hyperlipidemia     Past Surgical History:  Procedure Laterality Date  . CORONARY ARTERY BYPASS GRAFT      Current Outpatient Medications  Medication Sig Dispense Refill  . allopurinol (ZYLOPRIM) 300 MG tablet Take 300 mg by mouth daily.      Marland Kitchen amLODipine (NORVASC) 10 MG tablet TAKE 1 TABLET BY MOUTH DAILY 90 tablet 3  . aspirin (ASPIR-LOW) 81 MG EC tablet Take 81 mg by mouth daily.      . carvedilol (COREG) 3.125 MG tablet TAKE 1 TABLET BY MOUTH TWICE DAILY 180 tablet 1  . fish oil-omega-3 fatty acids 1000 MG capsule Take 2 g by mouth every morning. & 1 at supper    . furosemide (LASIX) 20 MG tablet Take 2 tablets (40 mg total) by mouth daily. 30 tablet 3  . levothyroxine (SYNTHROID, LEVOTHROID) 50 MCG tablet Take 50 mcg by mouth daily.    . potassium chloride SA (KLOR-CON) 20 MEQ tablet Take 1 tablet (20 mEq total) by mouth daily. 90 tablet 3  . simvastatin (ZOCOR) 10 MG tablet Take 1 tablet (10 mg total) by mouth daily. 30 tablet 6   No current facility-administered medications for this visit.   Allergies:  Patient has no known allergies.   Social History: The patient  reports that he quit smoking about 19 years ago. His smoking use included cigarettes. He started smoking about 58  years ago. He has a 48.00 pack-year smoking history. He has never used smokeless tobacco.   Family History: The patient's family history includes Heart attack in his mother; Stroke in his father.   ROS:  Please see the history of present illness. Otherwise, complete review of systems is positive for {NONE DEFAULTED:18576::"none"}.  All other systems are reviewed and negative.   Physical Exam: VS:  There were no vitals taken for this visit., BMI There is no height or weight on file to calculate BMI.  Wt Readings from Last 3 Encounters:  10/24/20 145 lb (65.8 kg)  04/02/20 142 lb (64.4 kg)  08/15/19 140 lb 9.6 oz (63.8 kg)    General: Patient appears comfortable at rest. HEENT: Conjunctiva and lids normal, oropharynx clear with moist mucosa. Neck: Supple, no elevated JVP or carotid bruits, no thyromegaly. Lungs: Clear to auscultation, nonlabored breathing at rest. Cardiac: Regular rate and rhythm, no S3 or significant systolic murmur, no pericardial rub. Abdomen: Soft, nontender, no hepatomegaly, bowel sounds present, no guarding or rebound. Extremities: No pitting edema, distal pulses 2+. Skin: Warm and dry. Musculoskeletal: No kyphosis. Neuropsychiatric: Alert and oriented x3, affect grossly appropriate.  ECG:  An ECG dated 10/24/2020 was personally reviewed today and demonstrated:  Sinus bradycardia with old inferior infarct pattern, low voltage and decreased R wave progression.  Recent Labwork:  June 2020: Hemoglobin 12.7, platelets 209, BNP 22, creatinine 1.1, potassium 4.8, AST 17, ALT 13, cholesterol 134, triglycerides 118, HDL 59, LDL 51, TSH 4.05, hemoglobin A1c 5.7%  Other Studies Reviewed Today:  Echocardiogram 12/12/2020: 1. Left ventricular ejection fraction, by estimation, is 55 to 60%. The  left ventricle has normal function. The left ventricle has no regional  wall motion abnormalities. Left ventricular diastolic parameters are  consistent with Grade II diastolic   dysfunction (pseudonormalization). There is the interventricular septum is  flattened in diastole ('D' shaped left ventricle), consistent with right  ventricular volume overload.  2. Right ventricular systolic function is moderately reduced. The right  ventricular size is moderately enlarged. There is mildly elevated  pulmonary artery systolic pressure. The estimated right ventricular  systolic pressure is 35.8 mmHg.  3. The mitral valve is grossly normal. Mild mitral valve regurgitation.  4. Tricuspid valve regurgitation is moderate to severe.  5. The aortic valve is tricuspid. Aortic valve regurgitation is not  visualized. Mild aortic valve sclerosis is present, with no evidence of  aortic valve stenosis.  6. The inferior vena cava is dilated in size with <50% respiratory  variability, suggesting right atrial pressure of 15 mmHg.   Assessment and Plan:    Medication Adjustments/Labs and Tests Ordered: Current medicines are reviewed at length with the patient today.  Concerns regarding medicines are outlined above.   Tests Ordered: No orders of the defined types were placed in this encounter.   Medication Changes: No orders of the defined types were placed in this encounter.   Disposition:  Follow up {follow up:15908}  Signed, Jonelle Sidle, MD, [facility] 04/24/2021 8:01 AM    [facility] at [facility] [address] [place], [place], [place] [address] Phone: [phone]; Fax: [phone]

## 2021-05-06 ENCOUNTER — Other Ambulatory Visit: Payer: Self-pay | Admitting: Cardiology

## 2021-05-22 NOTE — Progress Notes (Signed)
Cardiology Office Note  Date: 05/23/2021   ID: WHITFIELD DULAY, DOB 12-Sep-1935, MRN [id number]  PCP:  Assunta Found, MD  Cardiologist:  Nona Dell, MD Electrophysiologist:  None   Chief Complaint: 59-month follow-up  History of Present Illness: [name] is a 86 y.o. male with a history of CAD status post CABG 2003, chronic diastolic heart failure, HTN, HLD.  Previous history of right-sided pleural effusion and hypervolemia.  He was last seen by Dr. Diona Browner on 10/24/2020.  He reported no anginal symptoms, progressive shortness of breath, palpitations or syncope.  Previous LDL in prior year was 51.  Recent interval lab work was requested from Dr. Phillips Odor.  Since he was reporting no active anginal symptoms he was continuing aspirin, Norvasc, Coreg, and Zocor with continued observation.  A follow-up echocardiogram was being obtained for reassessment of cardiac murmur.  He was continuing with Lasix and potassium was supplemented.  His weight was stable.  He is here today for follow-up.  He denies any recent acute illnesses or hospitalizations since his last visit in December of last year.  He denies any anginal or exertional symptoms.  No complaints of palpitations or arrhythmias.  No complaints of lightheadedness, dizziness, presyncopal or syncopal episodes.  No DOE or SOB.  Weight is stable at 140.  He has lost about 5 pounds since last visit when he weighed 145 pounds.  Blood pressure today 138/70.  He is compliant with all of his medications which includes; amlodipine 10 mg daily, aspirin 81 mg daily, carvedilol 3.125 mg p.o. twice daily, Lasix 40 mg daily, potassium 20 mill equivalents daily, simvastatin 10 mg daily.  He states he will have upcoming lab work with Dr. Phillips Odor at his next visit.  He has some mild lower extremity edema with some areas of excoriation and skin flaking/scaling on both lower extremities which he states have been this way for over 2 years.  He denies any  claudication-like symptoms.  He denies any associated weeping or infected areas.  Past Medical History:  Diagnosis Date   CAD (coronary artery disease)    Status post CABG 2003 (LIMA to LAD, SVG to circumflex, SVG to RCA)   Essential hypertension    Gout    Hyperlipidemia     Past Surgical History:  Procedure Laterality Date   CORONARY ARTERY BYPASS GRAFT      Current Outpatient Medications  Medication Sig Dispense Refill   allopurinol (ZYLOPRIM) 300 MG tablet Take 300 mg by mouth daily.       amLODipine (NORVASC) 10 MG tablet TAKE 1 TABLET BY MOUTH DAILY 90 tablet 3   aspirin 81 MG EC tablet Take 81 mg by mouth daily.       carvedilol (COREG) 3.125 MG tablet TAKE 1 TABLET BY MOUTH TWICE DAILY 180 tablet 1   fish oil-omega-3 fatty acids 1000 MG capsule Take 2 g by mouth every morning. & 1 at supper     furosemide (LASIX) 20 MG tablet Take 2 tablets (40 mg total) by mouth daily. 30 tablet 3   levothyroxine (SYNTHROID, LEVOTHROID) 50 MCG tablet Take 50 mcg by mouth daily.     potassium chloride SA (KLOR-CON) 20 MEQ tablet Take 1 tablet (20 mEq total) by mouth daily. 90 tablet 3   simvastatin (ZOCOR) 10 MG tablet Take 1 tablet (10 mg total) by mouth daily. 30 tablet 6   No current facility-administered medications for this visit.   Allergies:  Patient has no known allergies.  Social History: The patient  reports that he quit smoking about 19 years ago. His smoking use included cigarettes. He started smoking about 58 years ago. He has a 48.00 pack-year smoking history. He has never used smokeless tobacco.   Family History: The patient's family history includes Heart attack in his mother; Stroke in his father.   ROS:  Please see the history of present illness. Otherwise, complete review of systems is positive for none.  All other systems are reviewed and negative.   Physical Exam: VS:  BP 138/70   Pulse (!) 49   Ht 5\' 3"  (1.6 m)   Wt 140 lb 3.2 oz (63.6 kg)   SpO2 100%   BMI  24.84 kg/m , BMI Body mass index is 24.84 kg/m.  Wt Readings from Last 3 Encounters:  05/23/21 140 lb 3.2 oz (63.6 kg)  10/24/20 145 lb (65.8 kg)  04/02/20 142 lb (64.4 kg)    General: Patient appears comfortable at rest. Neck: Supple, no elevated JVP or carotid bruits, no thyromegaly. Lungs: Clear to auscultation, nonlabored breathing at rest. Cardiac: Regular rate and rhythm, no S3 or significant systolic murmur, no pericardial rub. Extremities: No pitting edema, distal pulses 2+. Skin: Warm and dry.  He has some excoriated and scaly areas on both lower ankles with some mild anemia associated Musculoskeletal: No kyphosis. Neuropsychiatric: Alert and oriented x3, affect grossly appropriate.  ECG:    Recent Labwork: No results found for requested labs within last 8760 hours.  No results found for: CHOL, TRIG, HDL, CHOLHDL, VLDL, LDLCALC, LDLDIRECT  Other Studies Reviewed Today:   Echocardiogram 12/12/2020 1. Left ventricular ejection fraction, by estimation, is 55 to 60%. The left ventricle has normal function. The left ventricle has no regional wall motion abnormalities. Left ventricular diastolic parameters are consistent with Grade II diastolic dysfunction (pseudonormalization). There is the interventricular septum is flattened in diastole ('D' shaped left ventricle), consistent with right ventricular volume overload. 2. Right ventricular systolic function is moderately reduced. The right ventricular size is moderately enlarged. There is mildly elevated pulmonary artery systolic pressure. The estimated right ventricular systolic pressure is 35.8 mmHg. 3. The mitral valve is grossly normal. Mild mitral valve regurgitation. 4. Tricuspid valve regurgitation is moderate to severe. 5. The aortic valve is tricuspid. Aortic valve regurgitation is not visualized. Mild aortic valve sclerosis is present, with no evidence of aortic valve stenosis. 6. The inferior vena cava is dilated  in size with <50% respiratory variability, suggesting right atrial pressure of 15 mmHg. Comparison(s): Echocardiogram done 07/03/18 at [facility] showed an EF of 55-60%.  Lexiscan Myoview 08/10/2012: IMPRESSION:  Abnormal pharmacologic stress nuclear myocardial study revealing no  significant stress-induced EKG abnormalities, normal left  ventricular size and overall normal left ventricular systolic  function with a small segmental wall motion abnormality as  described.  By scintigraphic imaging, there is a small basilar  inferior myocardial infarction.  No evidence for ischemia.  Other  findings as noted.   Echocardiogram 07/01/2018 [facility]): LVEF 55 to 60%, moderately dilated right ventricle with moderately reduced contraction, moderate right atrial enlargement, mildly thickened aortic leaflets with mildly limited cusp excursion, moderate tricuspid regurgitation.  Assessment and Plan:  1. CAD in native artery   2. Mixed hyperlipidemia   3. Essential hypertension    1. CAD in native artery He denies any anginal or exertional symptoms.  Continue aspirin 81 mg.  2. Mixed hyperlipidemia Continue simvastatin 10 mg daily.  Patient states he will have lab work at  upcoming visit with his PCP.  3. Essential hypertension Blood pressure today 138/70.  Continue amlodipine 10 mg daily, carvedilol 3.125 mg p.o. twice daily.  Furosemide 40 mg daily with potassium supplementation.  Continue to monitor.  4.  Lower extremity edema/redness/excoriation/question stasis dermatitis Patient has evidence of reddened excoriated areas on his ankles pretibial area with flaking scaly skin with some associated edema.  He denies any claudication-like symptoms.  Denies any significant swelling or weeping from these areas.  He states he has no problems with his legs.  He is taking furosemide 40 mg daily.  Medication Adjustments/Labs and Tests Ordered: Current medicines are reviewed at length with the patient  today.  Concerns regarding medicines are outlined above.   Disposition: Follow-up with Dr. Diona Browner or APP 6 months  Signed, Rennis Harding, NP 05/23/2021 9:52 AM    [facility] at [facility] [address] [place], [place], [place] [address] Phone: [phone]; Fax: [phone]

## 2021-05-23 ENCOUNTER — Ambulatory Visit (INDEPENDENT_AMBULATORY_CARE_PROVIDER_SITE_OTHER): Payer: Medicare Other | Admitting: Family Medicine

## 2021-05-23 ENCOUNTER — Encounter: Payer: Self-pay | Admitting: Family Medicine

## 2021-05-23 VITALS — BP 138/70 | HR 49 | Ht 63.0 in | Wt 140.2 lb

## 2021-05-23 DIAGNOSIS — I10 Essential (primary) hypertension: Secondary | ICD-10-CM

## 2021-05-23 DIAGNOSIS — I2510 Atherosclerotic heart disease of native coronary artery without angina pectoris: Secondary | ICD-10-CM

## 2021-05-23 DIAGNOSIS — E782 Mixed hyperlipidemia: Secondary | ICD-10-CM | POA: Diagnosis not present

## 2021-05-23 DIAGNOSIS — I872 Venous insufficiency (chronic) (peripheral): Secondary | ICD-10-CM

## 2021-05-23 NOTE — Patient Instructions (Signed)
Medication Instructions:  Your physician recommends that you continue on your current medications as directed. Please refer to the Current Medication list given to you today.  *If you need a refill on your cardiac medications before your next appointment, please call your pharmacy*   Lab Work: None If you have labs (blood work) drawn today and your tests are completely normal, you will receive your results only by: MyChart Message (if you have MyChart) OR A paper copy in the mail If you have any lab test that is abnormal or we need to change your treatment, we will call you to review the results.   Testing/Procedures: None   Follow-Up: At [facility], you and your health needs are our priority.  As part of our continuing mission to provide you with exceptional heart care, we have created designated Provider Care Teams.  These Care Teams include your primary Cardiologist (physician) and Advanced Practice Providers (APPs -  Physician Assistants and Nurse Practitioners) who all work together to provide you with the care you need, when you need it.  We recommend signing up for the patient portal called "MyChart".  Sign up information is provided on this After Visit Summary.  MyChart is used to connect with patients for Virtual Visits (Telemedicine).  Patients are able to view lab/test results, encounter notes, upcoming appointments, etc.  Non-urgent messages can be sent to your provider as well.   To learn more about what you can do with MyChart, go to [email or web address].    Your next appointment:   6 month(s)  The format for your next appointment:   In Person  Provider:   Andy Quinn, NP   Other Instructions    

## 2021-06-01 DIAGNOSIS — R609 Edema, unspecified: Secondary | ICD-10-CM | POA: Diagnosis not present

## 2021-06-01 DIAGNOSIS — W19XXXA Unspecified fall, initial encounter: Secondary | ICD-10-CM | POA: Diagnosis not present

## 2021-06-01 DIAGNOSIS — M25562 Pain in left knee: Secondary | ICD-10-CM | POA: Diagnosis not present

## 2021-06-01 DIAGNOSIS — L539 Erythematous condition, unspecified: Secondary | ICD-10-CM | POA: Diagnosis not present

## 2021-06-01 DIAGNOSIS — M79662 Pain in left lower leg: Secondary | ICD-10-CM | POA: Diagnosis not present

## 2021-06-01 DIAGNOSIS — M7989 Other specified soft tissue disorders: Secondary | ICD-10-CM | POA: Diagnosis not present

## 2021-06-01 DIAGNOSIS — M109 Gout, unspecified: Secondary | ICD-10-CM | POA: Diagnosis not present

## 2021-06-01 DIAGNOSIS — I509 Heart failure, unspecified: Secondary | ICD-10-CM | POA: Diagnosis not present

## 2021-06-01 DIAGNOSIS — R531 Weakness: Secondary | ICD-10-CM | POA: Diagnosis not present

## 2021-06-01 DIAGNOSIS — M10062 Idiopathic gout, left knee: Secondary | ICD-10-CM | POA: Diagnosis not present

## 2021-06-01 DIAGNOSIS — J849 Interstitial pulmonary disease, unspecified: Secondary | ICD-10-CM | POA: Diagnosis not present

## 2021-06-01 DIAGNOSIS — R9431 Abnormal electrocardiogram [ECG] [EKG]: Secondary | ICD-10-CM | POA: Diagnosis not present

## 2021-06-01 DIAGNOSIS — R21 Rash and other nonspecific skin eruption: Secondary | ICD-10-CM | POA: Diagnosis not present

## 2021-06-01 DIAGNOSIS — R262 Difficulty in walking, not elsewhere classified: Secondary | ICD-10-CM | POA: Diagnosis not present

## 2021-06-01 DIAGNOSIS — I517 Cardiomegaly: Secondary | ICD-10-CM | POA: Diagnosis not present

## 2021-06-01 DIAGNOSIS — R001 Bradycardia, unspecified: Secondary | ICD-10-CM | POA: Diagnosis not present

## 2021-08-21 DIAGNOSIS — Z23 Encounter for immunization: Secondary | ICD-10-CM | POA: Diagnosis not present

## 2021-08-21 DIAGNOSIS — Z20828 Contact with and (suspected) exposure to other viral communicable diseases: Secondary | ICD-10-CM | POA: Diagnosis not present

## 2021-10-03 DIAGNOSIS — E039 Hypothyroidism, unspecified: Secondary | ICD-10-CM | POA: Diagnosis not present

## 2021-10-03 DIAGNOSIS — Z1331 Encounter for screening for depression: Secondary | ICD-10-CM | POA: Diagnosis not present

## 2021-10-03 DIAGNOSIS — M1712 Unilateral primary osteoarthritis, left knee: Secondary | ICD-10-CM | POA: Diagnosis not present

## 2021-10-03 DIAGNOSIS — Z0000 Encounter for general adult medical examination without abnormal findings: Secondary | ICD-10-CM | POA: Diagnosis not present

## 2021-10-03 DIAGNOSIS — Z6824 Body mass index (BMI) 24.0-24.9, adult: Secondary | ICD-10-CM | POA: Diagnosis not present

## 2021-10-03 DIAGNOSIS — E782 Mixed hyperlipidemia: Secondary | ICD-10-CM | POA: Diagnosis not present

## 2021-10-03 DIAGNOSIS — M1711 Unilateral primary osteoarthritis, right knee: Secondary | ICD-10-CM | POA: Diagnosis not present

## 2021-10-03 DIAGNOSIS — I10 Essential (primary) hypertension: Secondary | ICD-10-CM | POA: Diagnosis not present

## 2021-10-03 DIAGNOSIS — E119 Type 2 diabetes mellitus without complications: Secondary | ICD-10-CM | POA: Diagnosis not present

## 2021-10-09 ENCOUNTER — Other Ambulatory Visit: Payer: Self-pay | Admitting: Physician Assistant

## 2021-10-09 ENCOUNTER — Other Ambulatory Visit (HOSPITAL_COMMUNITY): Payer: Self-pay | Admitting: Physician Assistant

## 2021-10-09 DIAGNOSIS — R748 Abnormal levels of other serum enzymes: Secondary | ICD-10-CM

## 2021-10-21 ENCOUNTER — Encounter (HOSPITAL_COMMUNITY): Payer: Self-pay

## 2021-10-21 ENCOUNTER — Ambulatory Visit (HOSPITAL_COMMUNITY): Payer: Medicare Other

## 2021-10-28 DIAGNOSIS — R748 Abnormal levels of other serum enzymes: Secondary | ICD-10-CM | POA: Diagnosis not present

## 2021-10-28 DIAGNOSIS — N281 Cyst of kidney, acquired: Secondary | ICD-10-CM | POA: Diagnosis not present

## 2021-10-28 DIAGNOSIS — K824 Cholesterolosis of gallbladder: Secondary | ICD-10-CM | POA: Diagnosis not present

## 2021-11-04 ENCOUNTER — Other Ambulatory Visit: Payer: Self-pay | Admitting: Cardiology

## 2021-11-07 DIAGNOSIS — Z20828 Contact with and (suspected) exposure to other viral communicable diseases: Secondary | ICD-10-CM | POA: Diagnosis not present

## 2021-11-25 ENCOUNTER — Ambulatory Visit: Payer: Medicare Other | Admitting: Family Medicine

## 2021-12-02 ENCOUNTER — Ambulatory Visit: Payer: Medicare Other | Admitting: Cardiology

## 2021-12-02 NOTE — Progress Notes (Deleted)
Cardiology Office Note  Date: 12/02/2021   ID: [name], DOB [birth year], MRN [id number]  PCP:  Assunta Found, MD  Cardiologist:  Nona Dell, MD Electrophysiologist:  None   No chief complaint on file.   History of Present Illness: [name] is an 87 y.o. male last seen in July 2022.  Echocardiogram from January of last year revealed LVEF 55 to 60% with moderate diastolic dysfunction, moderate RV dysfunction with RVSP estimated 36 mmHg, mild to moderate mitral regurgitation, and moderate to severe tricuspid regurgitation.  Past Medical History:  Diagnosis Date   CAD (coronary artery disease)    Status post CABG 2003 (LIMA to LAD, SVG to circumflex, SVG to RCA)   Essential hypertension    Gout    Hyperlipidemia     Past Surgical History:  Procedure Laterality Date   CORONARY ARTERY BYPASS GRAFT      Current Outpatient Medications  Medication Sig Dispense Refill   allopurinol (ZYLOPRIM) 300 MG tablet Take 300 mg by mouth daily.       amLODipine (NORVASC) 10 MG tablet TAKE 1 TABLET BY MOUTH DAILY 90 tablet 3   aspirin 81 MG EC tablet Take 81 mg by mouth daily.       carvedilol (COREG) 3.125 MG tablet TAKE 1 TABLET BY MOUTH TWICE DAILY 180 tablet 1   fish oil-omega-3 fatty acids 1000 MG capsule Take 2 g by mouth every morning. & 1 at supper     furosemide (LASIX) 20 MG tablet Take 2 tablets (40 mg total) by mouth daily. 30 tablet 3   levothyroxine (SYNTHROID, LEVOTHROID) 50 MCG tablet Take 50 mcg by mouth daily.     potassium chloride SA (KLOR-CON) 20 MEQ tablet Take 1 tablet (20 mEq total) by mouth daily. 90 tablet 3   simvastatin (ZOCOR) 10 MG tablet Take 1 tablet (10 mg total) by mouth daily. 30 tablet 6   No current facility-administered medications for this visit.   Allergies:  Patient has no known allergies.   Social History: The patient  reports that he quit smoking about 20 years ago. His smoking use included cigarettes. He started smoking  about 59 years ago. He has a 48.00 pack-year smoking history. He has never used smokeless tobacco.   Family History: The patient's family history includes Heart attack in his mother; Stroke in his father.   ROS:  Please see the history of present illness. Otherwise, complete review of systems is positive for {NONE DEFAULTED:18576}.  All other systems are reviewed and negative.   Physical Exam: VS:  There were no vitals taken for this visit., BMI There is no height or weight on file to calculate BMI.  Wt Readings from Last 3 Encounters:  05/23/21 140 lb 3.2 oz (63.6 kg)  10/24/20 145 lb (65.8 kg)  04/02/20 142 lb (64.4 kg)    General: Patient appears comfortable at rest. HEENT: Conjunctiva and lids normal, oropharynx clear with moist mucosa. Neck: Supple, no elevated JVP or carotid bruits, no thyromegaly. Lungs: Clear to auscultation, nonlabored breathing at rest. Cardiac: Regular rate and rhythm, no S3 or significant systolic murmur, no pericardial rub. Abdomen: Soft, nontender, no hepatomegaly, bowel sounds present, no guarding or rebound. Extremities: No pitting edema, distal pulses 2+. Skin: Warm and dry. Musculoskeletal: No kyphosis. Neuropsychiatric: Alert and oriented x3, affect grossly appropriate.  ECG:  An ECG dated 10/24/2020 was personally reviewed today and demonstrated:  Sinus bradycardia with incomplete right bundle branch block and old inferior infarct  pattern, nonspecific T wave changes.  Recent Labwork:  July 2022: Hemoglobin 11.9, platelets 250, potassium 3.3, BUN 15, creatinine 1.1, AST 13, ALT 6, pro-BNP 4570  Other Studies Reviewed Today:  Echocardiogram 12/12/2020:  1. Left ventricular ejection fraction, by estimation, is 55 to 60%. The  left ventricle has normal function. The left ventricle has no regional  wall motion abnormalities. Left ventricular diastolic parameters are  consistent with Grade II diastolic  dysfunction (pseudonormalization). There is the  interventricular septum is  flattened in diastole ('D' shaped left ventricle), consistent with right  ventricular volume overload.   2. Right ventricular systolic function is moderately reduced. The right  ventricular size is moderately enlarged. There is mildly elevated  pulmonary artery systolic pressure. The estimated right ventricular  systolic pressure is 35.8 mmHg.   3. The mitral valve is grossly normal. Mild mitral valve regurgitation.   4. Tricuspid valve regurgitation is moderate to severe.   5. The aortic valve is tricuspid. Aortic valve regurgitation is not  visualized. Mild aortic valve sclerosis is present, with no evidence of  aortic valve stenosis.   6. The inferior vena cava is dilated in size with <50% respiratory  variability, suggesting right atrial pressure of 15 mmHg.   Assessment and Plan:    Medication Adjustments/Labs and Tests Ordered: Current medicines are reviewed at length with the patient today.  Concerns regarding medicines are outlined above.   Tests Ordered: No orders of the defined types were placed in this encounter.   Medication Changes: No orders of the defined types were placed in this encounter.   Disposition:  Follow up {follow up:15908}  Signed, Jonelle Sidle, MD, [facility] 12/02/2021 10:26 AM    [facility] at [facility] [address] [place], [place], [place] [address] Phone: [phone]; Fax: [phone]

## 2021-12-19 ENCOUNTER — Other Ambulatory Visit: Payer: Self-pay | Admitting: Cardiology

## 2022-02-17 DIAGNOSIS — R1013 Epigastric pain: Secondary | ICD-10-CM | POA: Diagnosis not present

## 2022-02-17 DIAGNOSIS — Z6824 Body mass index (BMI) 24.0-24.9, adult: Secondary | ICD-10-CM | POA: Diagnosis not present

## 2022-02-20 ENCOUNTER — Ambulatory Visit: Payer: Medicare Other | Admitting: Cardiology

## 2022-04-01 ENCOUNTER — Other Ambulatory Visit: Payer: Self-pay | Admitting: Cardiology

## 2022-05-03 ENCOUNTER — Other Ambulatory Visit: Payer: Self-pay | Admitting: Cardiology

## 2022-06-02 ENCOUNTER — Ambulatory Visit: Payer: Medicare Other | Admitting: Cardiology

## 2022-07-08 ENCOUNTER — Ambulatory Visit: Payer: Medicare Other | Admitting: Cardiology

## 2022-07-10 ENCOUNTER — Ambulatory Visit (INDEPENDENT_AMBULATORY_CARE_PROVIDER_SITE_OTHER): Payer: Medicare Other | Admitting: Cardiology

## 2022-07-10 ENCOUNTER — Encounter: Payer: Self-pay | Admitting: Cardiology

## 2022-07-10 VITALS — BP 150/70 | HR 53 | Ht 63.0 in | Wt 139.2 lb

## 2022-07-10 DIAGNOSIS — I25119 Atherosclerotic heart disease of native coronary artery with unspecified angina pectoris: Secondary | ICD-10-CM

## 2022-07-10 DIAGNOSIS — I358 Other nonrheumatic aortic valve disorders: Secondary | ICD-10-CM

## 2022-07-10 DIAGNOSIS — E782 Mixed hyperlipidemia: Secondary | ICD-10-CM

## 2022-07-10 NOTE — Patient Instructions (Addendum)

## 2022-07-10 NOTE — Progress Notes (Signed)
Cardiology Office Note  Date: 07/10/2022   ID: Huie, Ghuman February 03, 1935, MRN [id number]  PCP:  Assunta Found, MD  Cardiologist:  Nona Dell, MD Electrophysiologist:  None   Chief Complaint  Patient presents with   Cardiac follow-up    History of Present Illness: [name] is an 87 y.o. male last seen in July 2022 by Mr. Vincenza Hews NP.  He is here for a routine visit.  He does not report any angina symptoms with typical ADLs, NYHA class II dyspnea.  Still following with Robbie Lis for primary care, meets a new provider in November with physical.  I reviewed his medications today which are noted below.  I personally reviewed his ECG which shows sinus bradycardia with low voltage, probable old inferior infarct pattern.  Follow-up echocardiogram last year revealed normal LVEF at 55 to 60%, moderately enlarged RV with mildly elevated estimated RVSP, and sclerotic aortic valve without stenosis.  Past Medical History:  Diagnosis Date   CAD (coronary artery disease)    Status post CABG 2003 (LIMA to LAD, SVG to circumflex, SVG to RCA)   Essential hypertension    Gout    Hyperlipidemia     Past Surgical History:  Procedure Laterality Date   CORONARY ARTERY BYPASS GRAFT      Current Outpatient Medications  Medication Sig Dispense Refill   allopurinol (ZYLOPRIM) 300 MG tablet Take 300 mg by mouth daily.       amLODipine (NORVASC) 10 MG tablet TAKE 1 TABLET BY MOUTH DAILY 90 tablet 2   aspirin 81 MG EC tablet Take 81 mg by mouth daily.       carvedilol (COREG) 3.125 MG tablet TAKE 1 TABLET BY MOUTH TWICE DAILY 180 tablet 1   fish oil-omega-3 fatty acids 1000 MG capsule Take 2 g by mouth every morning. & 1 at supper     furosemide (LASIX) 20 MG tablet Take 2 tablets (40 mg total) by mouth daily. 30 tablet 3   levothyroxine (SYNTHROID, LEVOTHROID) 50 MCG tablet Take 50 mcg by mouth daily.     potassium chloride SA (KLOR-CON) 20 MEQ tablet Take 1 tablet (20 mEq total)  by mouth daily. 90 tablet 3   simvastatin (ZOCOR) 10 MG tablet Take 1 tablet (10 mg total) by mouth daily. 30 tablet 6   No current facility-administered medications for this visit.   Allergies:  Patient has no known allergies.   ROS: No palpitations or syncope.  Chronic hearing loss.  Physical Exam: VS:  BP (!) 150/70 (BP Location: Right Arm, Patient Position: Sitting, Cuff Size: Normal)   Pulse (!) 53   Ht 5\' 3"  (1.6 m)   Wt 139 lb 3.2 oz (63.1 kg)   SpO2 99%   BMI 24.66 kg/m , BMI Body mass index is 24.66 kg/m.  Wt Readings from Last 3 Encounters:  07/10/22 139 lb 3.2 oz (63.1 kg)  05/23/21 140 lb 3.2 oz (63.6 kg)  10/24/20 145 lb (65.8 kg)    General: Patient appears comfortable at rest. HEENT: Conjunctiva and lids normal. Neck: Supple, no elevated JVP or carotid bruits, no thyromegaly. Lungs: Clear to auscultation, nonlabored breathing at rest. Cardiac: Regular rate and rhythm, no S3, 2/6 systolic murmur. Extremities: No pitting edema.  ECG:  An ECG dated 10/24/2020 was personally reviewed today and demonstrated:  Sinus bradycardia with old inferior infarct pattern and nonspecific ST changes.  Recent Labwork:  July 2022: Hemoglobin 11.9, platelets 250, potassium 3.3, BUN 15, creatinine 1.1, AST  13, ALT 6, pro-BNP 4570  Other Studies Reviewed Today:  Echocardiogram 12/12/2020:  1. Left ventricular ejection fraction, by estimation, is 55 to 60%. The  left ventricle has normal function. The left ventricle has no regional  wall motion abnormalities. Left ventricular diastolic parameters are  consistent with Grade II diastolic  dysfunction (pseudonormalization). There is the interventricular septum is  flattened in diastole ('D' shaped left ventricle), consistent with right  ventricular volume overload.   2. Right ventricular systolic function is moderately reduced. The right  ventricular size is moderately enlarged. There is mildly elevated  pulmonary artery systolic  pressure. The estimated right ventricular  systolic pressure is 35.8 mmHg.   3. The mitral valve is grossly normal. Mild mitral valve regurgitation.   4. Tricuspid valve regurgitation is moderate to severe.   5. The aortic valve is tricuspid. Aortic valve regurgitation is not  visualized. Mild aortic valve sclerosis is present, with no evidence of  aortic valve stenosis.   6. The inferior vena cava is dilated in size with <50% respiratory  variability, suggesting right atrial pressure of 15 mmHg.   Assessment and Plan:  1.  Multivessel CAD status post CABG in 2003.  We have continued with medical therapy and observation in the absence of progressive angina symptoms.  LVEF 55 to 60% by follow-up echocardiogram last year.  ECG is stable.  Continue aspirin, Coreg, Norvasc, and Cozaar.  2.  Aortic valve sclerosis without stenosis.  3.  Mixed hyperlipidemia on Zocor.  He will see PCP later this year for routine follow-up.  Medication Adjustments/Labs and Tests Ordered: Current medicines are reviewed at length with the patient today.  Concerns regarding medicines are outlined above.   Tests Ordered: Orders Placed This Encounter  Procedures   EKG 12-Lead    Medication Changes: No orders of the defined types were placed in this encounter.   Disposition:  Follow up  1 year.  Signed, Jonelle Sidle, MD, [facility] 07/10/2022 4:14 PM    [facility] at [facility] [address] [place], [place], [place] [address] Phone: [phone]; Fax: [phone]

## 2022-08-26 DIAGNOSIS — Z23 Encounter for immunization: Secondary | ICD-10-CM | POA: Diagnosis not present

## 2022-08-30 DIAGNOSIS — S0001XA Abrasion of scalp, initial encounter: Secondary | ICD-10-CM | POA: Diagnosis not present

## 2022-08-30 DIAGNOSIS — Z9049 Acquired absence of other specified parts of digestive tract: Secondary | ICD-10-CM | POA: Diagnosis not present

## 2022-08-30 DIAGNOSIS — S0031XA Abrasion of nose, initial encounter: Secondary | ICD-10-CM | POA: Diagnosis not present

## 2022-08-30 DIAGNOSIS — G319 Degenerative disease of nervous system, unspecified: Secondary | ICD-10-CM | POA: Diagnosis not present

## 2022-08-30 DIAGNOSIS — I10 Essential (primary) hypertension: Secondary | ICD-10-CM | POA: Diagnosis not present

## 2022-08-30 DIAGNOSIS — M4812 Ankylosing hyperostosis [Forestier], cervical region: Secondary | ICD-10-CM | POA: Diagnosis not present

## 2022-08-30 DIAGNOSIS — Z79899 Other long term (current) drug therapy: Secondary | ICD-10-CM | POA: Diagnosis not present

## 2022-08-30 DIAGNOSIS — R2243 Localized swelling, mass and lump, lower limb, bilateral: Secondary | ICD-10-CM | POA: Diagnosis not present

## 2022-08-30 DIAGNOSIS — S0993XA Unspecified injury of face, initial encounter: Secondary | ICD-10-CM | POA: Diagnosis not present

## 2022-08-30 DIAGNOSIS — Z7982 Long term (current) use of aspirin: Secondary | ICD-10-CM | POA: Diagnosis not present

## 2022-08-30 DIAGNOSIS — S199XXA Unspecified injury of neck, initial encounter: Secondary | ICD-10-CM | POA: Diagnosis not present

## 2022-08-30 DIAGNOSIS — S3993XA Unspecified injury of pelvis, initial encounter: Secondary | ICD-10-CM | POA: Diagnosis not present

## 2022-08-30 DIAGNOSIS — S40811A Abrasion of right upper arm, initial encounter: Secondary | ICD-10-CM | POA: Diagnosis not present

## 2022-08-30 DIAGNOSIS — R590 Localized enlarged lymph nodes: Secondary | ICD-10-CM | POA: Diagnosis not present

## 2022-08-30 DIAGNOSIS — M7989 Other specified soft tissue disorders: Secondary | ICD-10-CM | POA: Diagnosis not present

## 2022-10-17 ENCOUNTER — Other Ambulatory Visit: Payer: Self-pay | Admitting: Cardiology

## 2022-12-14 ENCOUNTER — Other Ambulatory Visit: Payer: Self-pay | Admitting: Cardiology

## 2023-06-26 ENCOUNTER — Other Ambulatory Visit: Payer: Self-pay | Admitting: Cardiology

## 2023-09-15 ENCOUNTER — Other Ambulatory Visit: Payer: Self-pay | Admitting: Cardiology

## 2023-11-18 DEATH — deceased

## 2023-11-24 ENCOUNTER — Ambulatory Visit: Payer: Medicare Other | Admitting: Cardiology
